# Patient Record
Sex: Male | Born: 1960 | Race: White | Hispanic: No | Marital: Married | Smoking: Never smoker
Health system: Southern US, Community
[De-identification: ages and names within clinical notes are randomized; demographics above are authoritative.]

## PROBLEM LIST (undated history)

## (undated) DIAGNOSIS — E039 Hypothyroidism, unspecified: Secondary | ICD-10-CM

## (undated) DIAGNOSIS — E785 Hyperlipidemia, unspecified: Secondary | ICD-10-CM

## (undated) DIAGNOSIS — C8190 Hodgkin lymphoma, unspecified, unspecified site: Secondary | ICD-10-CM

## (undated) DIAGNOSIS — I2510 Atherosclerotic heart disease of native coronary artery without angina pectoris: Secondary | ICD-10-CM

## (undated) HISTORY — DX: Atherosclerotic heart disease of native coronary artery without angina pectoris: I25.10

## (undated) HISTORY — PX: CORONARY ANGIOPLASTY: SHX604

## (undated) HISTORY — DX: Hyperlipidemia, unspecified: E78.5

## (undated) HISTORY — DX: Hypothyroidism, unspecified: E03.9

## (undated) HISTORY — DX: Hodgkin lymphoma, unspecified, unspecified site: C81.90

---
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
## 1999-07-03 HISTORY — PX: NECK LESION BIOPSY: SHX2078

## 2004-08-08 ENCOUNTER — Ambulatory Visit: Payer: Self-pay | Admitting: Oncology

## 2004-08-10 ENCOUNTER — Ambulatory Visit: Payer: Self-pay | Admitting: Oncology

## 2004-08-30 ENCOUNTER — Ambulatory Visit: Payer: Self-pay | Admitting: Oncology

## 2005-02-08 ENCOUNTER — Ambulatory Visit: Payer: Self-pay | Admitting: Oncology

## 2005-03-02 ENCOUNTER — Ambulatory Visit: Payer: Self-pay | Admitting: Oncology

## 2005-08-07 ENCOUNTER — Ambulatory Visit: Payer: Self-pay | Admitting: Oncology

## 2005-08-09 ENCOUNTER — Ambulatory Visit: Payer: Self-pay | Admitting: Oncology

## 2005-08-30 ENCOUNTER — Ambulatory Visit: Payer: Self-pay | Admitting: Oncology

## 2005-12-24 IMAGING — CT CT CHEST-ABD-PELV W/ CM
1 of 3 series · 14 of 30 positions shown, 18 images · non-contrast
Comparison: none

REASON FOR EXAM: Hodgkin's disease
COMMENTS:

[Series 3: inspace · axial · 0.74mm/px · z∈[-636,-34]mm · 14 of 666 slices shown, 18 images]
[im 32/666  mediastinal]
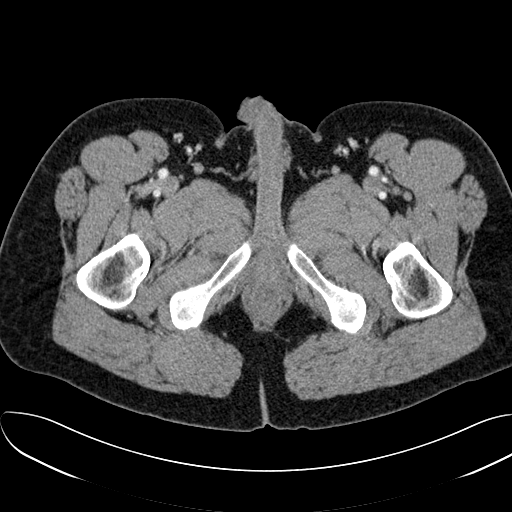
[im 32/666  bone]
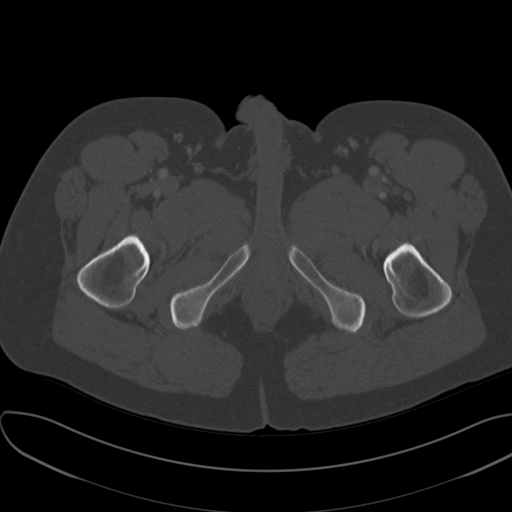
[im 96/666  mediastinal]
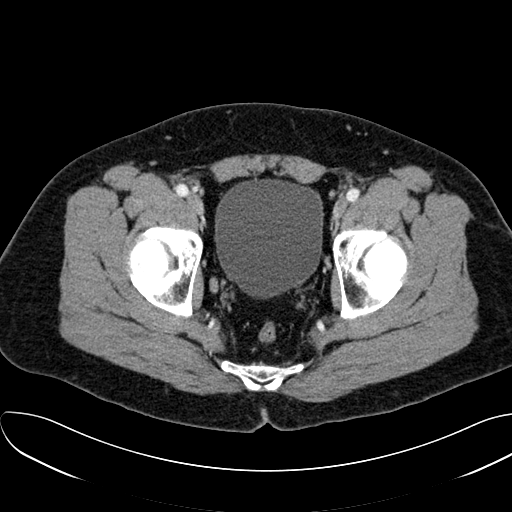
[im 159/666  mediastinal]
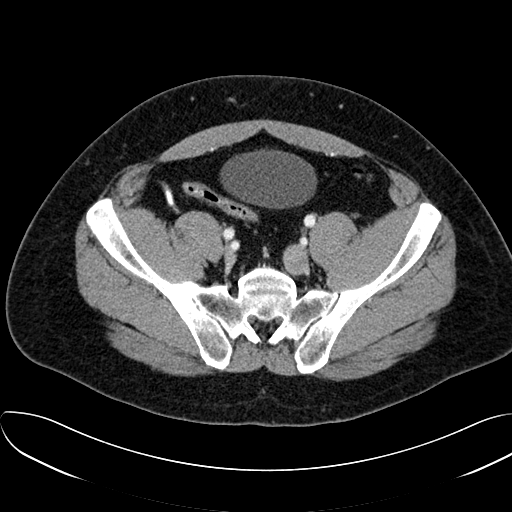
[im 222/666  mediastinal]
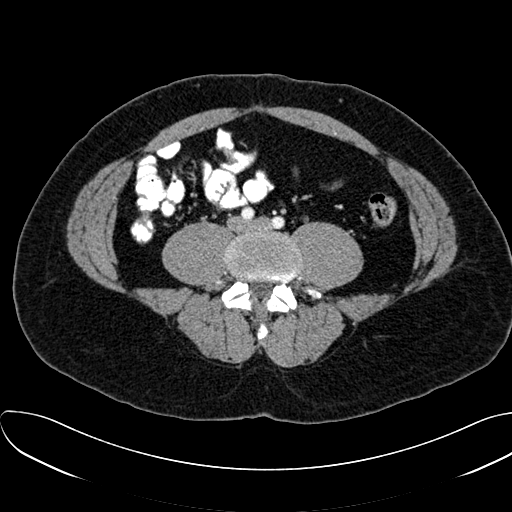
[im 254/666  mediastinal]
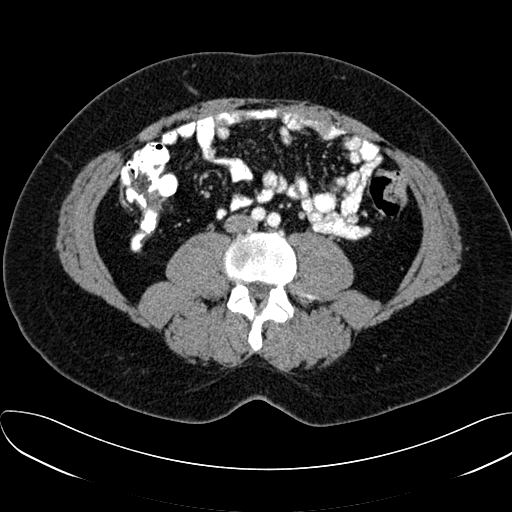
[im 317/666  mediastinal]
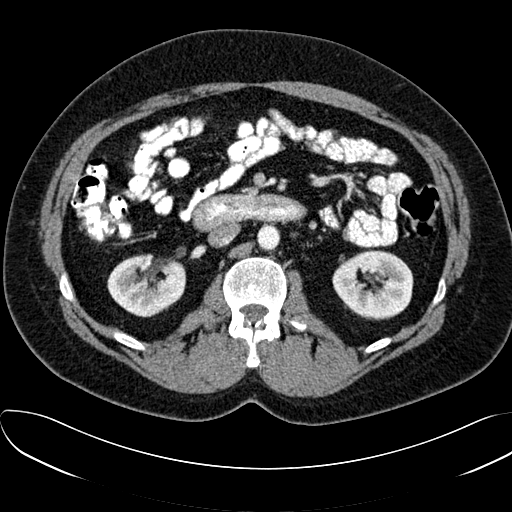
[im 349/666  mediastinal]
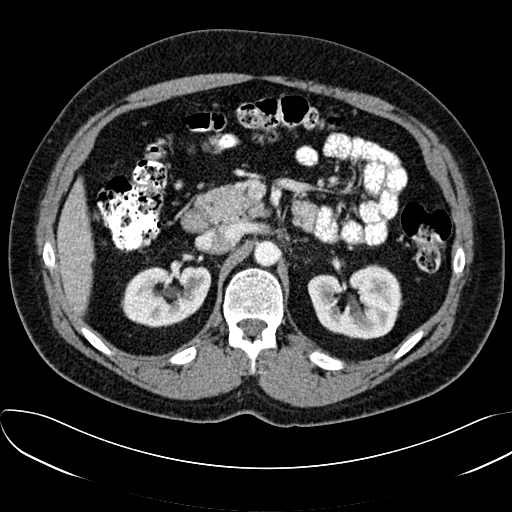
[im 412/666  mediastinal]
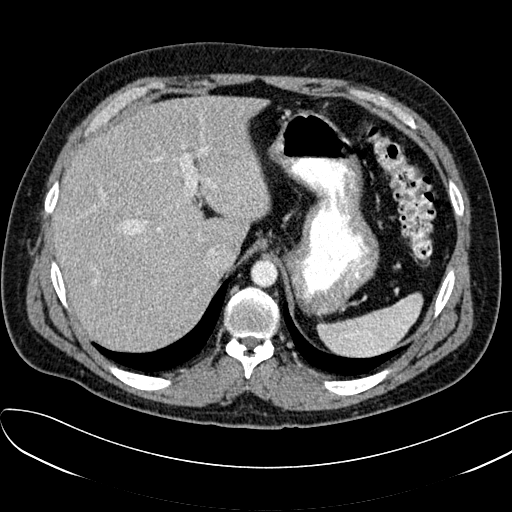
[im 444/666  mediastinal]
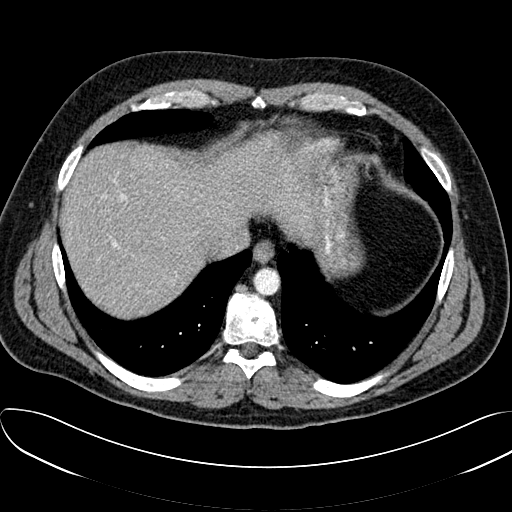
[im 444/666  bone]
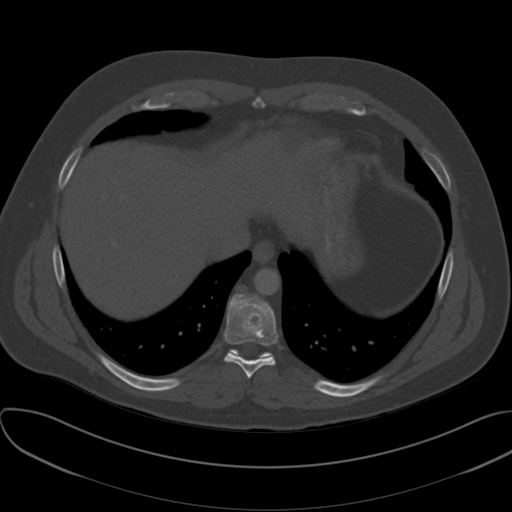
[im 507/666  mediastinal]
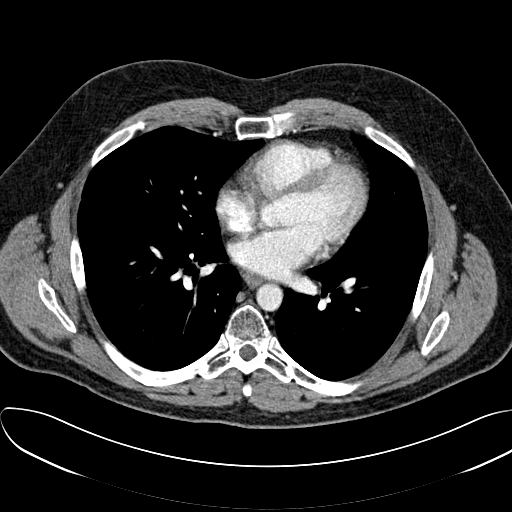
[im 539/666  lung]
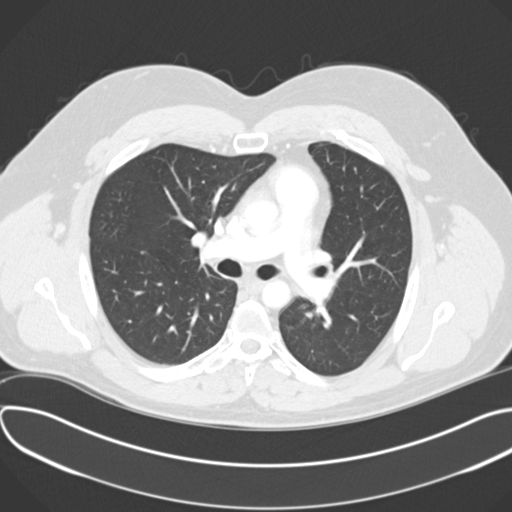
[im 571/666  mediastinal]
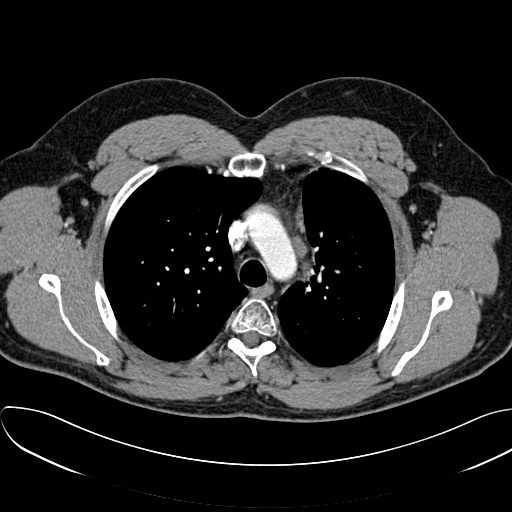
[im 571/666  lung]
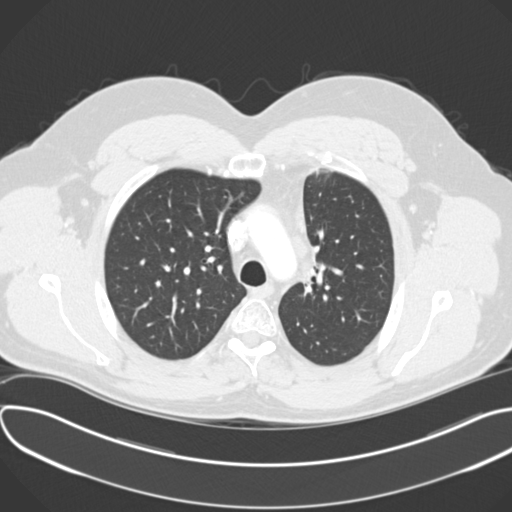
[im 602/666  lung]
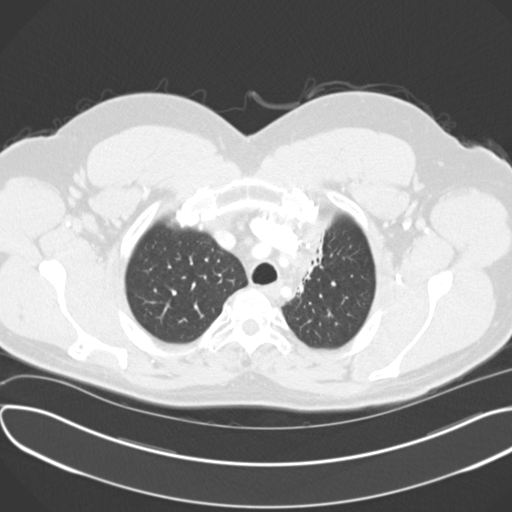
[im 634/666  mediastinal]
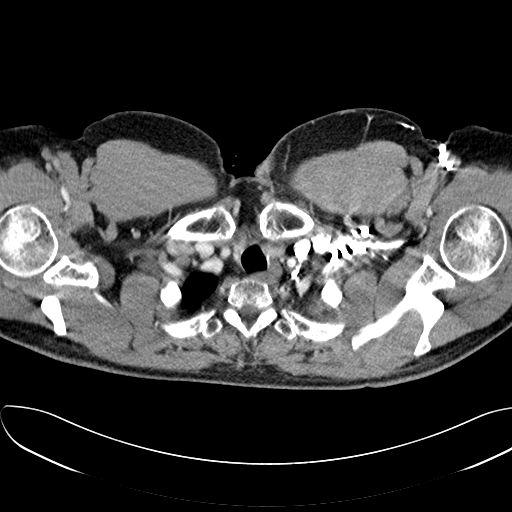
[im 634/666  lung]
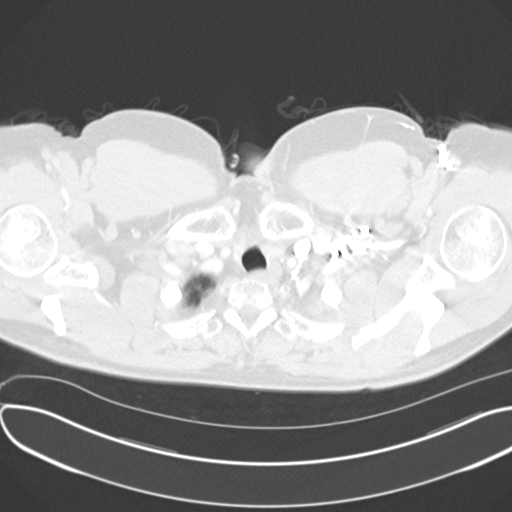

[14 of 30 positions shown; findings below may reference images not displayed]

PROCEDURE:     CT  - CT CHEST ABDOMEN AND PELVIS W  - August 08, 2004  [DATE]

RESULT:     Comparison is made to the prior similar CT scan performed on
08-03-03.

CT CHEST:  There is increased soft tissue density in the paramediastinal
region in the LEFT upper lobe, which was present on the prior study and
appears to be relatively stable.  This extends to the periaortic region at
the level of the aortic arch.  There is no evidence of axillary adenopathy.
No other areas of soft tissue density are seen in the hilar or mediastinal
region.  Lung window images show no evidence of focal infiltrate or focal
pulmonary masses.  There is some minimal atelectasis in the paramedial
region of the LEFT upper lobe adjacent to the area of soft tissue density
mentioned above.

CT ABDOMEN AND PELVIS:  IV and oral contrast-enhanced CT scan of the abdomen
and pelvis is compared to the prior study of 08-03-03.  The liver, spleen,
pancreas, gallbladder, and adrenal glands are normal.  The abdominal aorta
is normal in caliber.  There is no evidence of retroperitoneal, pelvic, or
inguinal adenopathy.  Some prostate calcification is noted.  No abnormal
bowel distention or significant bowel wall thickening is appreciated.  The
wall of the transverse portion of the duodenum appears to be slightly
thickened.  Correlation with endoscopy would be recommended.  This was not
seen on the prior study. The stomach appears to be unremarkable.  Some
scattered diverticula are seen within the colon.
IMPRESSION: Stable-appearing CT scan of the chest with no evidence of disease
progression.

Change in the appearance of the transverse portion of the duodenum.
Correlation with endoscopy would be recommended given the appearance of wall
thickening, which certainly could represent either inflammatory process or
the possibility of lymphomatous infiltration of the wall of the small bowel.
 The study is otherwise stable with stable-appearing paramedian density in
the chest as noted above.

## 2006-02-07 ENCOUNTER — Ambulatory Visit: Payer: Self-pay | Admitting: Oncology

## 2006-03-02 ENCOUNTER — Ambulatory Visit: Payer: Self-pay | Admitting: Oncology

## 2006-12-23 IMAGING — CT CT CHEST-ABD-PELV W/ CM
1 of 2 series · 14 of 29 positions shown, 19 images · non-contrast
Comparison: none

REASON FOR EXAM: Unkuri disease
COMMENTS:

PROCEDURE:     CT  - CT CHEST ABDOMEN AND PELVIS W  - August 07, 2005  [DATE]
RESULT:
REASON FOR CONSULTATION:   Hodgkin's disease.  One year followup.
TECHNIQUE: Axial images were obtained from the apices to the pubic
symphysis post intravenous and oral administration of contrast.  Mediastinal
and lung window settings were obtained of the chest.  The present study is
compared with prior exam of 08/08/04.

[Series 2: soft tissue · axial · 0.74mm/px · z∈[-544,+32]mm · 14 of 131 slices shown, 19 images]
[im 8/131  mediastinal]
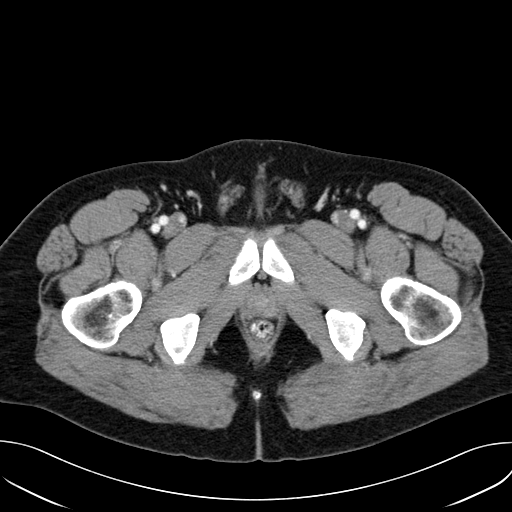
[im 8/131  bone]
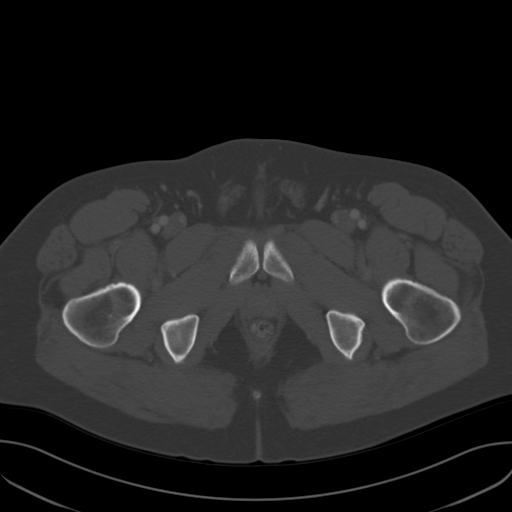
[im 22/131  mediastinal]
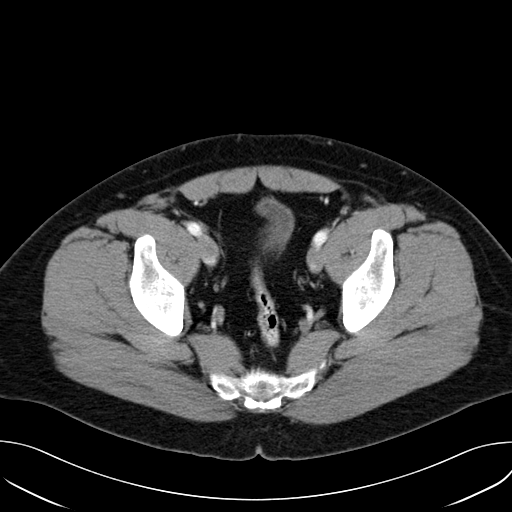
[im 29/131  mediastinal]
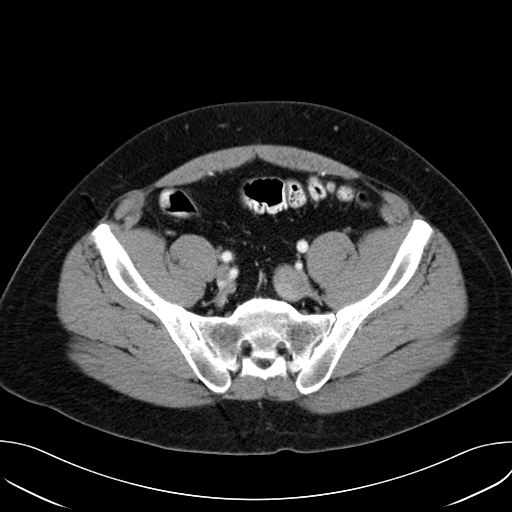
[im 37/131  mediastinal]
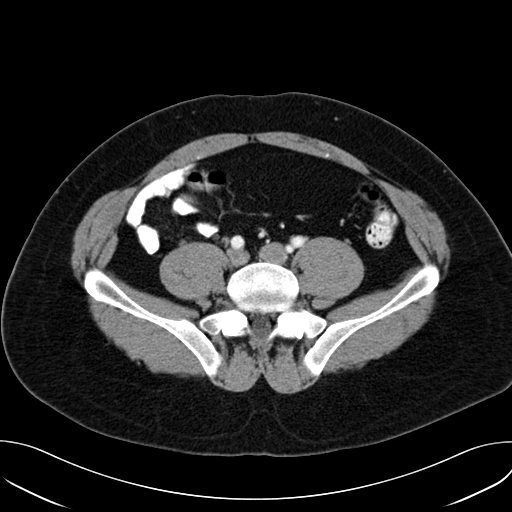
[im 51/131  mediastinal]
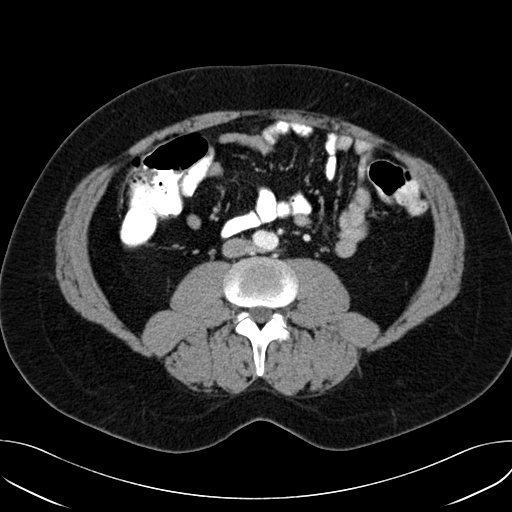
[im 58/131  mediastinal]
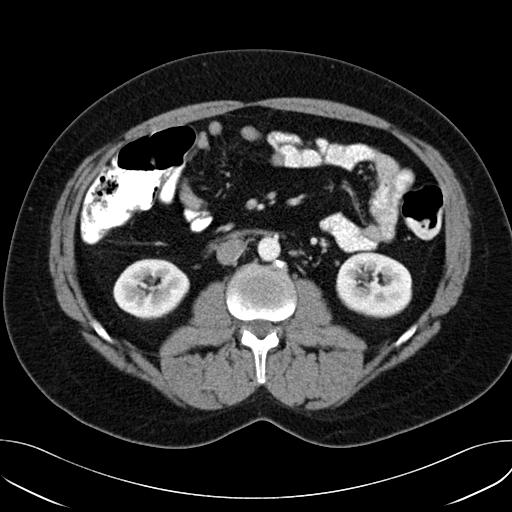
[im 66/131  mediastinal]
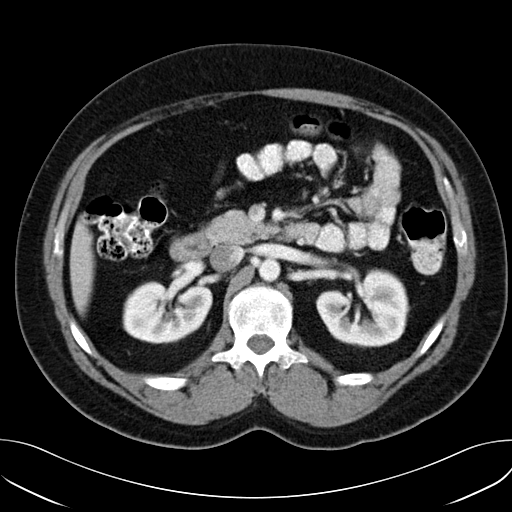
[im 73/131  mediastinal]
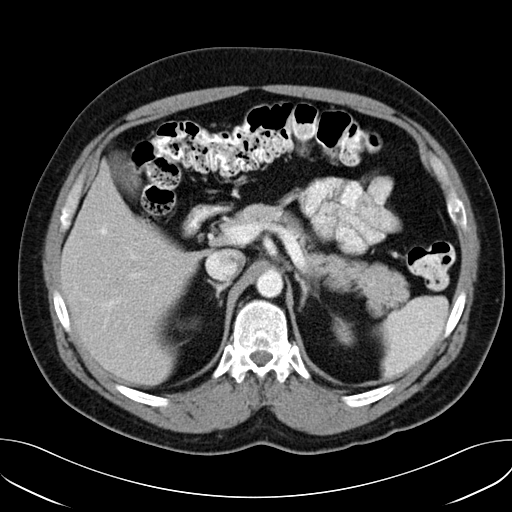
[im 80/131  mediastinal]
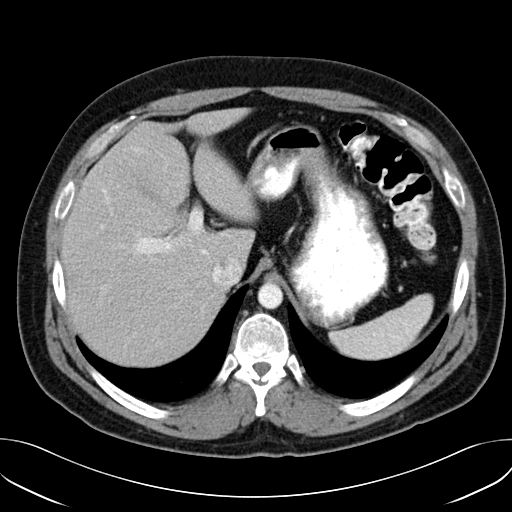
[im 80/131  bone]
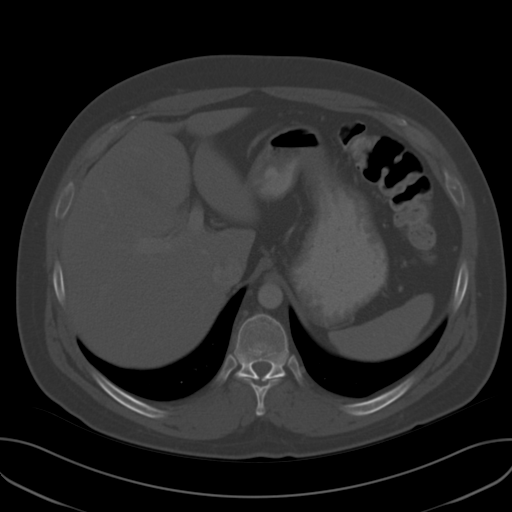
[im 94/131  mediastinal]
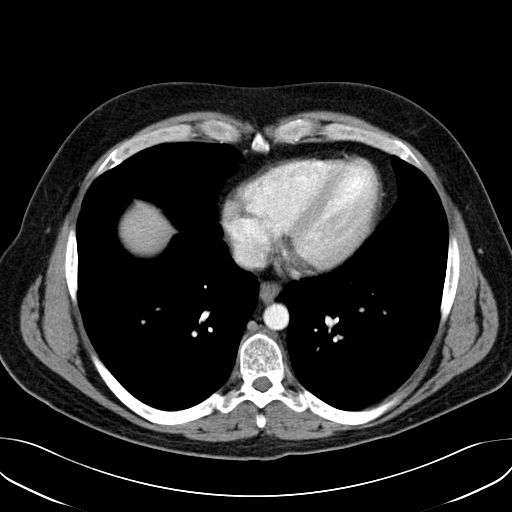
[im 102/131  mediastinal]
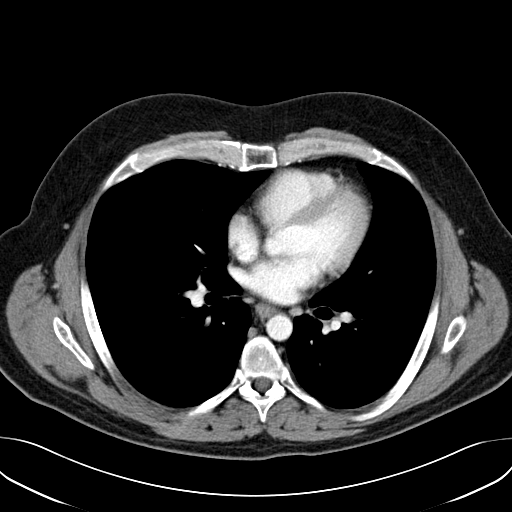
[im 102/131  lung]
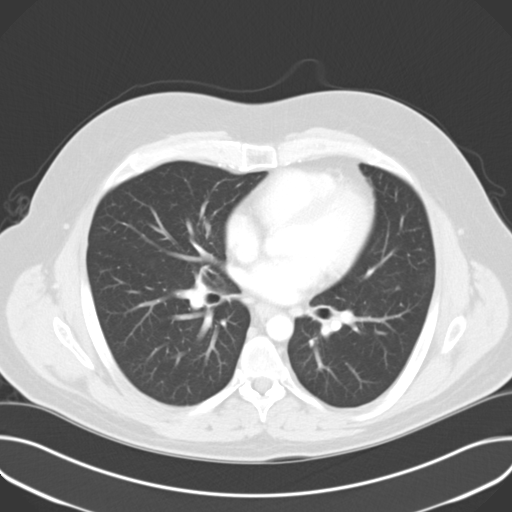
[im 109/131  mediastinal]
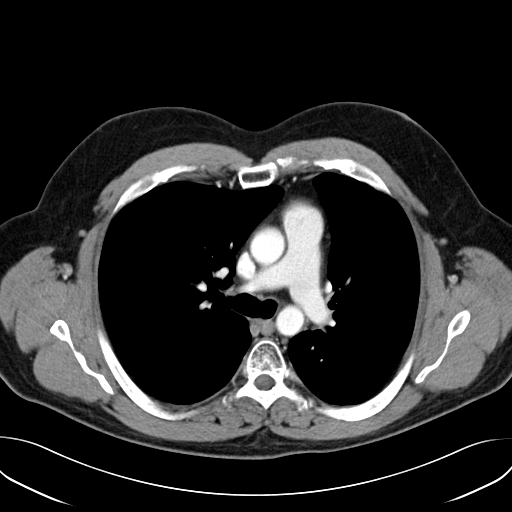
[im 109/131  lung]
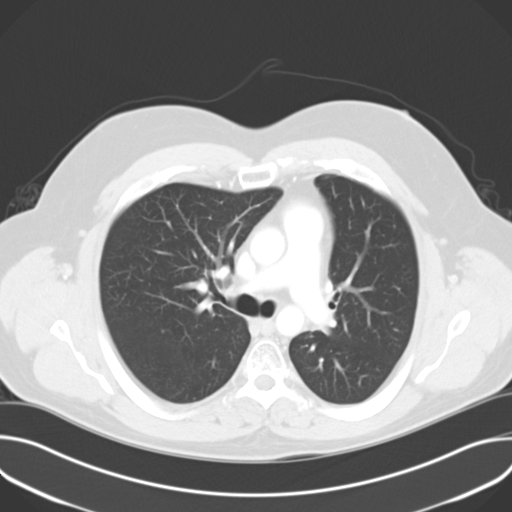
[im 116/131  lung]
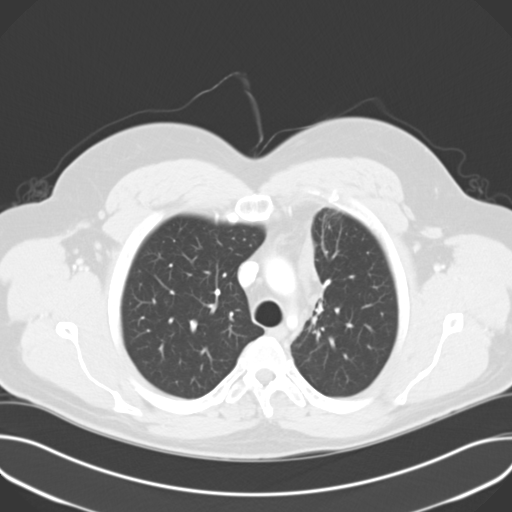
[im 123/131  mediastinal]
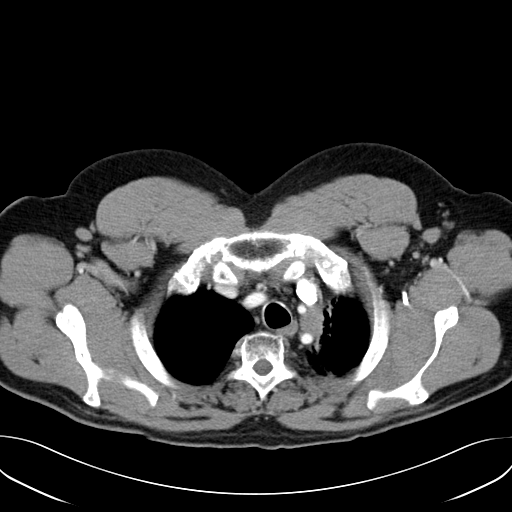
[im 123/131  lung]
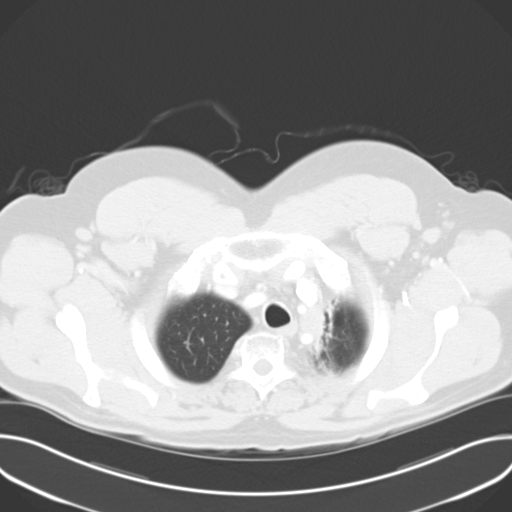

[14 of 29 positions shown; findings below may reference images not displayed]

FINDINGS: There is again noted LEFT paramediastinal soft tissue density
appearing unchanged in size from the previous exam of 08/08/04.  No hilar or
subcarinal adenopathy is noted.  On the lung window settings, there is some
atelectasis or scarring in the LEFT upper lobe adjacent to the soft tissue
density.   The lung window settings reveal the lung fields to be clear.  No
additional findings are noted on the lung window settings.  No effusions are
seen.

ABDOMINAL AND PELVIC CT

No retroperitoneal or pelvic adenopathy is identified.  Prostatic
calcifications are again seen.  The bowel appears intact.  No thickening of
the duodenum is noted on today's study.  Both kidneys excrete the contrast
material.  No enlargement of the kidneys is seen.
IMPRESSION: 1.     Chest appears stable from previous exam of 08/08/04.  There is
persistent LEFT paramediastinal soft tissue density unchanged as well as
some scarring or atelectasis adjacent to the lung fields.
2.     No definite evidence of adenopathy is seen.  No significant
abnormalities are noted on the abdominal and pelvic CT.

## 2007-01-31 ENCOUNTER — Ambulatory Visit: Payer: Self-pay | Admitting: Oncology

## 2007-02-06 ENCOUNTER — Ambulatory Visit: Payer: Self-pay | Admitting: Oncology

## 2007-03-03 ENCOUNTER — Ambulatory Visit: Payer: Self-pay | Admitting: Oncology

## 2008-01-31 ENCOUNTER — Ambulatory Visit: Payer: Self-pay | Admitting: Oncology

## 2008-02-04 ENCOUNTER — Ambulatory Visit: Payer: Self-pay | Admitting: Oncology

## 2008-03-02 ENCOUNTER — Ambulatory Visit: Payer: Self-pay | Admitting: Oncology

## 2009-03-02 ENCOUNTER — Ambulatory Visit: Payer: Self-pay | Admitting: Oncology

## 2009-03-10 ENCOUNTER — Ambulatory Visit: Payer: Self-pay | Admitting: Oncology

## 2009-04-01 ENCOUNTER — Ambulatory Visit: Payer: Self-pay | Admitting: Oncology

## 2010-03-02 ENCOUNTER — Ambulatory Visit: Payer: Self-pay | Admitting: Oncology

## 2010-04-01 ENCOUNTER — Ambulatory Visit: Payer: Self-pay | Admitting: Oncology

## 2012-10-14 ENCOUNTER — Ambulatory Visit: Payer: Self-pay | Admitting: General Surgery

## 2012-11-19 ENCOUNTER — Ambulatory Visit (INDEPENDENT_AMBULATORY_CARE_PROVIDER_SITE_OTHER): Payer: 59 | Admitting: General Surgery

## 2012-11-19 ENCOUNTER — Encounter: Payer: Self-pay | Admitting: General Surgery

## 2012-11-19 VITALS — BP 124/74 | HR 76 | Resp 12 | Ht 70.0 in | Wt 221.0 lb

## 2012-11-19 DIAGNOSIS — Z1211 Encounter for screening for malignant neoplasm of colon: Secondary | ICD-10-CM

## 2012-11-19 NOTE — Patient Instructions (Addendum)
Colonoscopy A colonoscopy is an exam to evaluate your entire colon. In this exam, your colon is cleansed. A long fiberoptic tube is inserted through your rectum and into your colon. The fiberoptic scope (endoscope) is a long bundle of enclosed and very flexible fibers. These fibers transmit light to the area examined and send images from that area to your caregiver. Discomfort is usually minimal. You may be given a drug to help you sleep (sedative) during or prior to the procedure. This exam helps to detect lumps (tumors), polyps, inflammation, and areas of bleeding. Your caregiver may also take a small piece of tissue (biopsy) that will be examined under a microscope. LET YOUR CAREGIVER KNOW ABOUT:   Allergies to food or medicine.  Medicines taken, including vitamins, herbs, eyedrops, over-the-counter medicines, and creams.  Use of steroids (by mouth or creams).  Previous problems with anesthetics or numbing medicines.  History of bleeding problems or blood clots.  Previous surgery.  Other health problems, including diabetes and kidney problems.  Possibility of pregnancy, if this applies. BEFORE THE PROCEDURE   A clear liquid diet may be required for 2 days before the exam.  Ask your caregiver about changing or stopping your regular medications.  Liquid injections (enemas) or laxatives may be required.  A large amount of electrolyte solution may be given to you to drink over a short period of time. This solution is used to clean out your colon.  You should be present 60 minutes prior to your procedure or as directed by your caregiver. AFTER THE PROCEDURE   If you received a sedative or pain relieving medication, you will need to arrange for someone to drive you home.  Occasionally, there is a little blood passed with the first bowel movement. Do not be concerned. FINDING OUT THE RESULTS OF YOUR TEST Not all test results are available during your visit. If your test results are  not back during the visit, make an appointment with your caregiver to find out the results. Do not assume everything is normal if you have not heard from your caregiver or the medical facility. It is important for you to follow up on all of your test results. HOME CARE INSTRUCTIONS   It is not unusual to pass moderate amounts of gas and experience mild abdominal cramping following the procedure. This is due to air being used to inflate your colon during the exam. Walking or a warm pack on your belly (abdomen) may help.  You may resume all normal meals and activities after sedatives and medicines have worn off.  Only take over-the-counter or prescription medicines for pain, discomfort, or fever as directed by your caregiver. Do not use aspirin or blood thinners if a biopsy was taken. Consult your caregiver for medicine usage if biopsies were taken. SEEK IMMEDIATE MEDICAL CARE IF:   You have a fever.  You pass large blood clots or fill a toilet with blood following the procedure. This may also occur 10 to 14 days following the procedure. This is more likely if a biopsy was taken.  You develop abdominal pain that keeps getting worse and cannot be relieved with medicine. Document Released: 06/15/2000 Document Revised: 09/10/2011 Document Reviewed: 01/29/2008 [facility] Patient Information 2014 [place], [place].  Patient wishes to wait till September 2014 to have colonoscopy. This patient will be contacted once schedule is available. A pre-op visit will not be required. Patient has paperwork and Miralax prescription will be sent once date is arranged. Patient has been asked to  discontinue fish oil one week prior to procedure. It is okay for patient to continue 81 mg aspirin.

## 2012-11-19 NOTE — Progress Notes (Signed)
Patient ID: Steven Bolton, male   DOB: 05-Dec-1960, 52 y.o.   MRN: [id number]  Chief Complaint  Patient presents with  . Other    screening colonoscopy    HPI Steven Bolton is a 52 y.o. male here today for an colon screening. Patient has none prior.No family history of colon cancer.   HPI  History reviewed. No pertinent past medical history.  Past Surgical History  Procedure Laterality Date  . Appendectomy  1977  . Neck lesion biopsy  2001    Family History  Problem Relation Age of Onset  . Lung cancer Mother     Social History History  Substance Use Topics  . Smoking status: Never Smoker   . Smokeless tobacco: Never Used  . Alcohol Use: No    No Known Allergies  Current Outpatient Prescriptions  Medication Sig Dispense Refill  . levothyroxine (SYNTHROID, LEVOTHROID) 88 MCG tablet        No current facility-administered medications for this visit.    Review of Systems Review of Systems  Respiratory: Negative.   Cardiovascular: Negative.   Gastrointestinal: Negative.     Blood pressure 124/74, pulse 76, resp. rate 12, height 5\' 10"  (1.778 m), weight 221 lb (100.245 kg).  Physical Exam Physical Exam  Constitutional: He appears well-developed and well-nourished.  Eyes: Conjunctivae are normal. No scleral icterus.  Neck: Neck supple.  Cardiovascular: Normal rate, regular rhythm and normal heart sounds.   Pulmonary/Chest: Effort normal and breath sounds normal.  Abdominal: Soft. Bowel sounds are normal.    Data Reviewed None  Assessment    Candidate for screening colonoscopy.    Plan    Indications for the procedure as well as the risks and benefits were discussed in detail.    Patient wishes to wait till September 2014 to have colonoscopy. This patient will be contacted once schedule is available. A pre-op visit will not be required.  Patient has been asked to discontinue fish oil one week prior to procedure. It is okay for patient to continue 81 mg  aspirin.   Earline Mayotte 11/20/2012, 5:47 PM

## 2012-11-20 ENCOUNTER — Encounter: Payer: Self-pay | Admitting: General Surgery

## 2012-11-20 DIAGNOSIS — Z1211 Encounter for screening for malignant neoplasm of colon: Secondary | ICD-10-CM | POA: Insufficient documentation

## 2013-01-29 ENCOUNTER — Telehealth: Payer: Self-pay | Admitting: *Deleted

## 2013-01-29 DIAGNOSIS — Z1211 Encounter for screening for malignant neoplasm of colon: Secondary | ICD-10-CM

## 2013-01-29 MED ORDER — POLYETHYLENE GLYCOL 3350 17 GM/SCOOP PO POWD: ORAL | Status: DC

## 2013-01-29 NOTE — Telephone Encounter (Signed)
Patient was contacted today to schedule colonoscopy. This has been arranged at [facility] for 03-18-13. This patient has been asked to discontinue fish oil one week prior to procedure. He is aware it is okay to continue 81 mg aspirin. Patient will be contacted prior to verify no medication changes.   Miralax prescription has been sent to patient's pharmacy.

## 2013-03-11 ENCOUNTER — Telehealth: Payer: Self-pay | Admitting: *Deleted

## 2013-03-11 NOTE — Telephone Encounter (Signed)
Patient reports no medication changes since last office visit. He was instructed to call pre-register since he has not done so already. Patient was also reminded to stop fish oil. We will proceed with colonoscopy that is scheduled at [facility] for 03-18-13. He will call the office if he has further questions.

## 2013-03-15 ENCOUNTER — Other Ambulatory Visit: Payer: Self-pay | Admitting: General Surgery

## 2013-03-15 DIAGNOSIS — Z1211 Encounter for screening for malignant neoplasm of colon: Secondary | ICD-10-CM

## 2013-03-18 ENCOUNTER — Ambulatory Visit: Payer: Self-pay | Admitting: General Surgery

## 2013-03-18 DIAGNOSIS — Z1211 Encounter for screening for malignant neoplasm of colon: Secondary | ICD-10-CM

## 2013-03-18 DIAGNOSIS — D128 Benign neoplasm of rectum: Secondary | ICD-10-CM

## 2013-03-18 DIAGNOSIS — D129 Benign neoplasm of anus and anal canal: Secondary | ICD-10-CM

## 2013-03-19 ENCOUNTER — Encounter: Payer: Self-pay | Admitting: General Surgery

## 2013-03-23 ENCOUNTER — Encounter: Payer: Self-pay | Admitting: General Surgery

## 2013-03-24 ENCOUNTER — Telehealth: Payer: Self-pay | Admitting: *Deleted

## 2013-03-24 NOTE — Telephone Encounter (Signed)
Notified patient as instructed, patient pleased. Discussed follow-up appointments for 10 year, patient agrees

## 2013-03-24 NOTE — Telephone Encounter (Signed)
Message copied by Currie Paris on Tue Mar 24, 2013  8:09 AM ------      Message from: [place], [place] W      Created: Mon Mar 23, 2013  1:50 PM       Notify polyp fine.  Repeat exam in 10 years, earlier if any symptoms.       ----- Message -----         From: Jena Gauss, CMA         Sent: 03/23/2013  10:33 AM           To: Earline Mayotte, MD                   ------

## 2013-03-24 NOTE — Telephone Encounter (Signed)
Message copied by Currie Paris on Tue Mar 24, 2013  8:10 AM ------      Message from: [place], [place] W      Created: Mon Mar 23, 2013  1:50 PM       Notify polyp fine.  Repeat exam in 10 years, earlier if any symptoms.       ----- Message -----         From: Jena Gauss, CMA         Sent: 03/23/2013  10:33 AM           To: Earline Mayotte, MD                   ------

## 2013-07-02 HISTORY — PX: CARDIAC CATHETERIZATION: SHX172

## 2014-02-17 ENCOUNTER — Inpatient Hospital Stay: Payer: Self-pay | Admitting: Internal Medicine

## 2014-02-17 LAB — TROPONIN I
TROPONIN-I: 0.12 ng/mL — AB
Troponin-I: 0.57 ng/mL — ABNORMAL HIGH

## 2014-02-17 LAB — BASIC METABOLIC PANEL
ANION GAP: 5 — AB (ref 7–16)
BUN: 28 mg/dL — ABNORMAL HIGH (ref 7–18)
CALCIUM: 8.6 mg/dL (ref 8.5–10.1)
CO2: 31 mmol/L (ref 21–32)
CREATININE: 1.54 mg/dL — AB (ref 0.60–1.30)
Chloride: 107 mmol/L (ref 98–107)
EGFR (Non-African Amer.): 51 — ABNORMAL LOW
GFR CALC AF AMER: 59 — AB
GLUCOSE: 94 mg/dL (ref 65–99)
OSMOLALITY: 290 (ref 275–301)
POTASSIUM: 4 mmol/L (ref 3.5–5.1)
SODIUM: 143 mmol/L (ref 136–145)

## 2014-02-17 LAB — CBC
HCT: 46.7 % (ref 40.0–52.0)
HGB: 15.4 g/dL (ref 13.0–18.0)
MCH: 31.4 pg (ref 26.0–34.0)
MCHC: 32.9 g/dL (ref 32.0–36.0)
MCV: 95 fL (ref 80–100)
PLATELETS: 310 10*3/uL (ref 150–440)
RBC: 4.9 10*6/uL (ref 4.40–5.90)
RDW: 13.3 % (ref 11.5–14.5)
WBC: 9.8 10*3/uL (ref 3.8–10.6)

## 2014-02-17 LAB — PRO B NATRIURETIC PEPTIDE: B-Type Natriuretic Peptide: 27 pg/mL (ref 0–125)

## 2014-02-17 LAB — APTT: ACTIVATED PTT: 27.6 s (ref 23.6–35.9)

## 2014-02-17 LAB — PROTIME-INR
INR: 1.1
PROTHROMBIN TIME: 14.2 s (ref 11.5–14.7)

## 2014-02-17 LAB — CK-MB: CK-MB: 3.4 ng/mL (ref 0.5–3.6)

## 2014-02-18 DIAGNOSIS — I2510 Atherosclerotic heart disease of native coronary artery without angina pectoris: Secondary | ICD-10-CM

## 2014-02-18 DIAGNOSIS — I214 Non-ST elevation (NSTEMI) myocardial infarction: Secondary | ICD-10-CM

## 2014-02-18 DIAGNOSIS — I059 Rheumatic mitral valve disease, unspecified: Secondary | ICD-10-CM

## 2014-02-18 LAB — BASIC METABOLIC PANEL
ANION GAP: 9 (ref 7–16)
BUN: 21 mg/dL — ABNORMAL HIGH (ref 7–18)
CALCIUM: 8.1 mg/dL — AB (ref 8.5–10.1)
CO2: 29 mmol/L (ref 21–32)
CREATININE: 1.23 mg/dL (ref 0.60–1.30)
Chloride: 108 mmol/L — ABNORMAL HIGH (ref 98–107)
EGFR (African American): >60
Glucose: 89 mg/dL (ref 65–99)
Osmolality: 293 (ref 275–301)
Potassium: 3.9 mmol/L (ref 3.5–5.1)
Sodium: 146 mmol/L — ABNORMAL HIGH (ref 136–145)

## 2014-02-18 LAB — CBC WITH DIFFERENTIAL/PLATELET
Basophil #: 0.1 10*3/uL (ref 0.0–0.1)
Basophil %: 1 %
EOS PCT: 8.6 %
Eosinophil #: 0.7 10*3/uL (ref 0.0–0.7)
HCT: 46.1 % (ref 40.0–52.0)
HGB: 15.4 g/dL (ref 13.0–18.0)
Lymphocyte #: 2.6 10*3/uL (ref 1.0–3.6)
Lymphocyte %: 29.8 %
MCH: 31.5 pg (ref 26.0–34.0)
MCHC: 33.3 g/dL (ref 32.0–36.0)
MCV: 95 fL (ref 80–100)
Monocyte #: 1 x10 3/mm (ref 0.2–1.0)
Monocyte %: 12 %
NEUTROS ABS: 4.2 10*3/uL (ref 1.4–6.5)
Neutrophil %: 48.6 %
PLATELETS: 294 10*3/uL (ref 150–440)
RBC: 4.88 10*6/uL (ref 4.40–5.90)
RDW: 13.4 % (ref 11.5–14.5)
WBC: 8.6 10*3/uL (ref 3.8–10.6)

## 2014-02-18 LAB — CK-MB
CK-MB: 3.4 ng/mL (ref 0.5–3.6)
CK-MB: 3.3 ng/mL (ref 0.5–3.6)

## 2014-02-18 LAB — TROPONIN I: Troponin-I: 0.55 ng/mL — ABNORMAL HIGH

## 2014-02-19 ENCOUNTER — Telehealth: Payer: Self-pay | Admitting: *Deleted

## 2014-02-19 ENCOUNTER — Telehealth: Payer: Self-pay

## 2014-02-19 DIAGNOSIS — I214 Non-ST elevation (NSTEMI) myocardial infarction: Secondary | ICD-10-CM

## 2014-02-19 LAB — CK-MB: CK-MB: 2 ng/mL (ref 0.5–3.6)

## 2014-02-19 LAB — BASIC METABOLIC PANEL
Anion Gap: 8 (ref 7–16)
BUN: 14 mg/dL (ref 7–18)
CALCIUM: 8.3 mg/dL — AB (ref 8.5–10.1)
CHLORIDE: 110 mmol/L — AB (ref 98–107)
Co2: 25 mmol/L (ref 21–32)
Creatinine: 1.09 mg/dL (ref 0.60–1.30)
EGFR (African American): >60
EGFR (Non-African Amer.): >60
Glucose: 86 mg/dL (ref 65–99)
OSMOLALITY: 285 (ref 275–301)
Potassium: 4.3 mmol/L (ref 3.5–5.1)
SODIUM: 143 mmol/L (ref 136–145)

## 2014-02-19 NOTE — Telephone Encounter (Signed)
Patient stated he started Brilinta today  He is worried after reading the side effects that Brilinta will make him dizzy  He drives a lot for work   I suggested patient take the medication at home so he can see how it will effect him before he drives Keep follow up appt  Instructed him to call if he experiences any dizziness

## 2014-02-19 NOTE — Telephone Encounter (Signed)
Patient contacted regarding discharge from [facility] on 02/19/14.  Patient understands to follow up with Ignacia Bayley, NP on 03/05/14 at 1:15 at [facility]. Patient understands discharge instructions? yes Patient understands medications and regiment? yes Patient understands to bring all medications to this visit? yes

## 2014-02-19 NOTE — Telephone Encounter (Signed)
Patient called and is worried about the side effects of Brilinta. States the side effects make you dizzy. 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

## 2014-02-22 ENCOUNTER — Telehealth: Payer: Self-pay | Admitting: Nurse Practitioner

## 2014-02-22 NOTE — Telephone Encounter (Signed)
Entered in error

## 2014-02-23 ENCOUNTER — Telehealth: Payer: Self-pay

## 2014-02-23 ENCOUNTER — Encounter: Payer: Self-pay | Admitting: Cardiovascular Disease

## 2014-02-23 NOTE — Telephone Encounter (Signed)
Pt states Dr. Fletcher Anon released him to go back to work with no restrictions, states his employer wants him to stay out another week, and he needs a note stating this.

## 2014-02-24 NOTE — Telephone Encounter (Signed)
LVM 8/26

## 2014-02-24 NOTE — Telephone Encounter (Signed)
That is fine. Ask which date he wants to go back and write him a letter.

## 2014-02-24 NOTE — Telephone Encounter (Signed)
This encounter was created in error - please disregard.

## 2014-02-25 ENCOUNTER — Encounter: Payer: Self-pay | Admitting: *Deleted

## 2014-02-25 ENCOUNTER — Other Ambulatory Visit: Payer: Self-pay

## 2014-02-25 ENCOUNTER — Telehealth: Payer: Self-pay | Admitting: *Deleted

## 2014-02-25 MED ORDER — TICAGRELOR 90 MG PO TABS: 90.0000 mg | ORAL_TABLET | Freq: Two times a day (BID) | ORAL | Status: DC

## 2014-02-25 NOTE — Telephone Encounter (Signed)
Patient called to let you know after dinner has a little bit of chest tightness and sob.

## 2014-02-25 NOTE — Telephone Encounter (Signed)
Work letter faxed.

## 2014-02-25 NOTE — Telephone Encounter (Signed)
Refill sent for brilinta

## 2014-02-25 NOTE — Telephone Encounter (Signed)
That's ok.

## 2014-02-25 NOTE — Telephone Encounter (Signed)
Returned call to patient  He stated he had a small amount of chest tightness after lunch  It went away very quickly and he is feeling great now  He just wanted to make sure it was all right

## 2014-02-26 ENCOUNTER — Encounter (INDEPENDENT_AMBULATORY_CARE_PROVIDER_SITE_OTHER): Payer: Self-pay

## 2014-02-26 ENCOUNTER — Encounter: Payer: Self-pay | Admitting: Nurse Practitioner

## 2014-02-26 ENCOUNTER — Ambulatory Visit (INDEPENDENT_AMBULATORY_CARE_PROVIDER_SITE_OTHER): Payer: 59 | Admitting: Nurse Practitioner

## 2014-02-26 VITALS — BP 120/88 | HR 77 | Ht 69.0 in | Wt 207.5 lb

## 2014-02-26 DIAGNOSIS — I214 Non-ST elevation (NSTEMI) myocardial infarction: Secondary | ICD-10-CM

## 2014-02-26 DIAGNOSIS — C8190 Hodgkin lymphoma, unspecified, unspecified site: Secondary | ICD-10-CM | POA: Insufficient documentation

## 2014-02-26 DIAGNOSIS — E039 Hypothyroidism, unspecified: Secondary | ICD-10-CM | POA: Insufficient documentation

## 2014-02-26 DIAGNOSIS — R079 Chest pain, unspecified: Secondary | ICD-10-CM

## 2014-02-26 DIAGNOSIS — E785 Hyperlipidemia, unspecified: Secondary | ICD-10-CM | POA: Insufficient documentation

## 2014-02-26 DIAGNOSIS — I2510 Atherosclerotic heart disease of native coronary artery without angina pectoris: Secondary | ICD-10-CM

## 2014-02-26 MED ORDER — METOPROLOL SUCCINATE ER 25 MG PO TB24
25.0000 mg | ORAL_TABLET | Freq: Every day | ORAL | Status: DC
Start: 2014-02-26 — End: 2015-06-28

## 2014-02-26 MED ORDER — ATORVASTATIN CALCIUM 40 MG PO TABS
40.0000 mg | ORAL_TABLET | Freq: Every day | ORAL | Status: DC
Start: 2014-02-26 — End: 2015-01-13

## 2014-02-26 MED ORDER — TICAGRELOR 90 MG PO TABS: 90.0000 mg | ORAL_TABLET | Freq: Two times a day (BID) | ORAL | Status: DC

## 2014-02-26 MED ORDER — NITROGLYCERIN 0.4 MG SL SUBL: 0.4000 mg | SUBLINGUAL_TABLET | SUBLINGUAL | Status: DC | PRN

## 2014-02-26 NOTE — Patient Instructions (Addendum)
Your physician has recommended you make the following change in your medication:  Start Nitro Sublingual 0.4 mg. Place one tablet under the tongue every 5 minutes x 3 for chest pain    Your cardiac meds have been refilled as requested   Your physician recommends that you schedule a follow-up appointment in:  3 months with Dr. Fletcher Anon

## 2014-02-26 NOTE — Progress Notes (Signed)
Patient Name: Steven Bolton Date of Encounter: 02/26/2014  Primary Care Provider:  Albina Billet, MD Primary Cardiologist:  Jerilynn Mages. Fletcher Anon, MD   Patient Profile  53 y/o male with recent NSTEMI and PCI/DES to the LCX, who presents for f/u.  Problem List   Past Medical History  Diagnosis Date  . Hypothyroidism   . Hodgkin's lymphoma   . Coronary artery disease     a. 01/2014 NSTEMI/PCI: LM 20ost, LAD 20p, D1/2 nl, D2 small/nl, LCX 90p (3.5x12 Resolute Integrity DES), OM1/2/3 nl, LPL nl, RCA non-dominant, min irregs, EF 50%; b. 01/2014 Echo: EF 50-55%, diast dysfxn, mild MR, mildly dil LA.  Marland Kitchen Hyperlipidemia    Past Surgical History  Procedure Laterality Date  . Appendectomy  1977  . Neck lesion biopsy  2001  . Cardiac catheterization  2015    x1 stent  . Coronary angioplasty      Allergies  Allergies  Allergen Reactions  . Prednisone     jittery    HPI  53 y/o male with the above problem list. He was recently admitted to [facility] with chest pain and r/i for NSTEMI.  He underwent cath revealing severe LCX dzs and otw non-obs dzs.  The LCX was successfully treated with a DES.  He tolerated the procedure well and was d/c'd the following day.  He has since recovered well and has been partaking in cardiac rehab.  He did not mild, brief, subjective dyspnea on one occasion earlier this week, but this has not recurred since.  He denies chest pain, palpitations, pnd, orthopnea, n, v, dizziness, syncope, edema, weight gain, or early satiety.   Home Medications  Prior to Admission medications   Medication Sig Start Date End Date Taking? Authorizing Provider  aspirin 81 MG tablet Take 81 mg by mouth daily.   Yes Historical Provider, MD  atorvastatin (LIPITOR) 40 MG tablet Take 1 tablet (40 mg total) by mouth daily. 02/26/14  Yes Rogelia Mire, NP  azelastine (ASTELIN) 0.1 % nasal spray 1 spray as needed.  02/12/14  Yes Historical Provider, MD  cefdinir (OMNICEF) 300 MG capsule Take 300 mg  by mouth daily.  02/12/14  Yes Historical Provider, MD  cholecalciferol (VITAMIN D) 1000 UNITS tablet Take 1,000 Units by mouth 2 (two) times daily.   Yes Historical Provider, MD  levothyroxine (SYNTHROID, LEVOTHROID) 88 MCG tablet Take 88 mcg by mouth daily before breakfast.  10/27/12  Yes Historical Provider, MD  metoprolol succinate (TOPROL-XL) 25 MG 24 hr tablet Take 1 tablet (25 mg total) by mouth daily. 02/26/14  Yes Rogelia Mire, NP  Omega-3 Fatty Acids (FISH OIL) 1000 MG CAPS Take by mouth daily.   Yes Historical Provider, MD  ticagrelor (BRILINTA) 90 MG TABS tablet Take 1 tablet (90 mg total) by mouth 2 (two) times daily. 02/26/14  Yes Rogelia Mire, NP  nitroGLYCERIN (NITROSTAT) 0.4 MG SL tablet Place 1 tablet (0.4 mg total) under the tongue every 5 (five) minutes as needed for chest pain. 02/26/14   Rogelia Mire, NP    Review of Systems  As above doing well and tolerating cardiac rehab well.  He did have one brief, mild episode of subj dyspnea, which has not recurred. He sometimes feels sluggish in the morning however this resolves fairly quickly.  All other systems reviewed and are otherwise negative except as noted above.  Physical Exam  Blood pressure 120/88, pulse 77, height 5\' 9"  (1.753 m), weight 207 lb 8 oz (94.121 kg).  General: Pleasant, NAD Psych: Normal affect. Neuro: Alert and oriented X 3. Moves all extremities spontaneously. HEENT: Normal  Neck: Supple without bruits or JVD. Lungs:  Resp regular and unlabored, CTA. Heart: RRR no s3, s4, or murmurs. Abdomen: Soft, non-tender, non-distended, BS + x 4.  Extremities: No clubbing, cyanosis or edema. DP/PT/Radials 2+ and equal bilaterally.  R wrist cath site w/o bleeding/bruit/hematoma.  Accessory Clinical Findings  ECG - RSR, 77, no acute st/t changes.  Assessment & Plan  1.  NSTEMI, subsequent episode of care/CAD:  Pt presents following recent nstemi and PCI of the LCX.  Overall, he has been doing  remarkably well and is enrolled in cardiac rehab and tolerating it well.  He did have a brief episode of mild subjective dyspnea, which resolved spontaneously.  This may be r/t brilinta but has not recurred.  Cont asa, brilinta, bb, statin.  2.  HL:  Cont statin.  He will need f/u lipids and lfts @ next office visit.  3.  Dispo:  F/U in 3 mos or sooner if necessary.  Murray Hodgkins, NP 02/26/2014, 3:34 PM

## 2014-03-01 ENCOUNTER — Encounter: Payer: Self-pay | Admitting: Nurse Practitioner

## 2014-03-02 ENCOUNTER — Encounter: Payer: Self-pay | Admitting: Cardiovascular Disease

## 2014-03-05 ENCOUNTER — Encounter: Payer: Self-pay | Admitting: Nurse Practitioner

## 2014-03-20 DIAGNOSIS — E785 Hyperlipidemia, unspecified: Secondary | ICD-10-CM

## 2014-03-20 DIAGNOSIS — I2510 Atherosclerotic heart disease of native coronary artery without angina pectoris: Secondary | ICD-10-CM

## 2014-03-20 DIAGNOSIS — I214 Non-ST elevation (NSTEMI) myocardial infarction: Secondary | ICD-10-CM

## 2014-04-01 ENCOUNTER — Encounter: Payer: Self-pay | Admitting: Cardiovascular Disease

## 2014-04-20 ENCOUNTER — Encounter: Payer: Self-pay | Admitting: Cardiovascular Disease

## 2014-05-02 ENCOUNTER — Encounter: Payer: Self-pay | Admitting: Cardiovascular Disease

## 2014-05-24 ENCOUNTER — Telehealth: Payer: Self-pay | Admitting: Cardiovascular Disease

## 2014-05-24 NOTE — Telephone Encounter (Signed)
Patient has been having dizzy spells he wondered if it was the atorvastatin He had a physical recently and he was told his blood pressure was low  I told him it was most likely not the atorvastatin causing this issue  I told him the Metoprolol was likely having the effect on his BP and he could hold it until tomorrow evening since he will be driving  Patient verbalized understanding

## 2014-05-24 NOTE — Telephone Encounter (Signed)
Pt is going to be doing a lot of driving tmrw and he thinks is because his med Atorvastatin. Pt wants to know if he takes in the evening instead of the morning. Please call pt. Every morning pt get a bit dizzy after taking med so this is why he would rather take it at night.

## 2014-05-25 NOTE — Telephone Encounter (Signed)
I agree. He should get his BP/HR checked.

## 2014-05-25 NOTE — Telephone Encounter (Signed)
Spoke with patient  He stated he held is metoprolol this am  He states he "feels better than he has in a long time"  He stated he is no longer dizzzy  Discussed this with Dr. Fletcher Anon  He stated for patient to hold metoprolol until his appt 06/04/14 Patient verbalized understanding   Instructed patient to call with any symptom changes

## 2014-05-25 NOTE — Telephone Encounter (Signed)
LVM 11/24

## 2014-06-04 ENCOUNTER — Ambulatory Visit (INDEPENDENT_AMBULATORY_CARE_PROVIDER_SITE_OTHER): Payer: 59 | Admitting: Cardiovascular Disease

## 2014-06-04 ENCOUNTER — Encounter: Payer: Self-pay | Admitting: Cardiovascular Disease

## 2014-06-04 VITALS — BP 110/70 | HR 65

## 2014-06-04 DIAGNOSIS — E785 Hyperlipidemia, unspecified: Secondary | ICD-10-CM

## 2014-06-04 DIAGNOSIS — I2510 Atherosclerotic heart disease of native coronary artery without angina pectoris: Secondary | ICD-10-CM

## 2014-06-04 NOTE — Progress Notes (Signed)
HPI  53 y/o male who is here today for a follow-up visit regarding coronary artery disease. He was admitted to [facility] in October with  NSTEMI.  He underwent cath revealing severe LCX dzs and otw non-obs dzs.  The LCX was successfully treated with a DES.    He has since recovered well and attended cardiac rehabilitation briefly.  He denies chest pain, palpitations, pnd, orthopnea, n, v, dizziness, syncope, edema, weight gain, or early satiety. Metoprolol was discontinued due to dizziness. Symptoms resolved after that. He reports no side effects to medications.   Allergies  Allergen Reactions  . Prednisone     jittery     Current Outpatient Prescriptions on File Prior to Visit  Medication Sig Dispense Refill  . aspirin 81 MG tablet Take 81 mg by mouth daily.    Marland Kitchen atorvastatin (LIPITOR) 40 MG tablet Take 1 tablet (40 mg total) by mouth daily. 90 tablet 3  . azelastine (ASTELIN) 0.1 % nasal spray 1 spray as needed.     . cefdinir (OMNICEF) 300 MG capsule Take 300 mg by mouth daily.     . cholecalciferol (VITAMIN D) 1000 UNITS tablet Take 1,000 Units by mouth 2 (two) times daily.    Marland Kitchen levothyroxine (SYNTHROID, LEVOTHROID) 88 MCG tablet Take 88 mcg by mouth daily before breakfast.     . metoprolol succinate (TOPROL-XL) 25 MG 24 hr tablet Take 1 tablet (25 mg total) by mouth daily. 90 tablet 3  . nitroGLYCERIN (NITROSTAT) 0.4 MG SL tablet Place 1 tablet (0.4 mg total) under the tongue every 5 (five) minutes as needed for chest pain. 25 tablet 3  . Omega-3 Fatty Acids (FISH OIL) 1000 MG CAPS Take by mouth daily.    . ticagrelor (BRILINTA) 90 MG TABS tablet Take 1 tablet (90 mg total) by mouth 2 (two) times daily. 180 tablet 3   No current facility-administered medications on file prior to visit.     Past Medical History  Diagnosis Date  . Hypothyroidism   . Hodgkin's lymphoma   . Coronary artery disease     a. 01/2014 NSTEMI/PCI: LM 20ost, LAD 20p, D1/2 nl, D2 small/nl, LCX 90p (3.5x12  Resolute Integrity DES), OM1/2/3 nl, LPL nl, RCA non-dominant, min irregs, EF 50%; b. 01/2014 Echo: EF 50-55%, diast dysfxn, mild MR, mildly dil LA.  Marland Kitchen Hyperlipidemia      Past Surgical History  Procedure Laterality Date  . Appendectomy  1977  . Neck lesion biopsy  2001  . Cardiac catheterization  2015    x1 stent  . Coronary angioplasty       Family History  Problem Relation Age of Onset  . Lung cancer Mother   . Heart attack Mother   . Cancer Father      History   Social History  . Marital Status: Married    Spouse Name: N/A    Number of Children: N/A  . Years of Education: N/A   Occupational History  . Not on file.   Social History Main Topics  . Smoking status: Never Smoker   . Smokeless tobacco: Never Used  . Alcohol Use: No  . Drug Use: No  . Sexual Activity: Not on file   Other Topics Concern  . Not on file   Social History Narrative      PHYSICAL EXAM   BP 110/70 mmHg  Pulse 65  Constitutional: He is oriented to person, place, and time. He appears well-developed and well-nourished. No distress.  HENT: No  nasal discharge.  Head: Normocephalic and atraumatic.  Eyes: Pupils are equal and round.  No discharge. Neck: Normal range of motion. Neck supple. No JVD present. No thyromegaly present.  Cardiovascular: Normal rate, regular rhythm, normal heart sounds. Exam reveals no gallop and no friction rub. No murmur heard.  Pulmonary/Chest: Effort normal and breath sounds normal. No stridor. No respiratory distress. He has no wheezes. He has no rales. He exhibits no tenderness.  Abdominal: Soft. Bowel sounds are normal. He exhibits no distension. There is no tenderness. There is no rebound and no guarding.  Musculoskeletal: Normal range of motion. He exhibits no edema and no tenderness.  Neurological: He is alert and oriented to person, place, and time. Coordination normal.  Skin: Skin is warm and dry. No rash noted. He is not diaphoretic. No erythema. No  pallor.  Psychiatric: He has a normal mood and affect. His behavior is normal. Judgment and thought content normal.      EKG: Normal sinus rhythm with no significant ST or T wave changes   ASSESSMENT AND PLAN

## 2014-06-04 NOTE — Assessment & Plan Note (Signed)
Continue treatment with atorvastatin. I requested fasting lipid and liver profile. 

## 2014-06-04 NOTE — Patient Instructions (Signed)
Your physician recommends that you continue on your current medications as directed. Please refer to the Current Medication list given to you today.  Your physician wants you to follow-up in: 6 months. You will receive a reminder letter in the mail two months in advance. If you don't receive a letter, please call our office to schedule the follow-up appointment.  Your physician recommends that you return for lab work in:  Fasting lipid and liver within 2-3 weeks

## 2014-06-04 NOTE — Assessment & Plan Note (Signed)
He is doing very well overall with no symptoms suggestive of angina. Continue medical therapy. 

## 2014-06-08 ENCOUNTER — Other Ambulatory Visit: Payer: Self-pay | Admitting: Cardiovascular Disease

## 2014-06-09 LAB — HEPATIC FUNCTION PANEL
ALBUMIN: 4.5 g/dL (ref 3.5–5.5)
ALT: 33 IU/L (ref 0–44)
AST: 27 IU/L (ref 0–40)
Alkaline Phosphatase: 74 IU/L (ref 39–117)
BILIRUBIN DIRECT: 0.22 mg/dL (ref 0.00–0.40)
TOTAL PROTEIN: 7.7 g/dL (ref 6.0–8.5)
Total Bilirubin: 0.8 mg/dL (ref 0.0–1.2)

## 2014-06-09 LAB — LIPID PANEL W/O CHOL/HDL RATIO
Cholesterol, Total: 111 mg/dL (ref 100–199)
HDL: 46 mg/dL (ref >39)
LDL Calculated: 50 mg/dL (ref 0–99)
Triglycerides: 73 mg/dL (ref 0–149)
VLDL Cholesterol Cal: 15 mg/dL (ref 5–40)

## 2014-06-16 ENCOUNTER — Encounter: Payer: Self-pay | Admitting: Cardiovascular Disease

## 2014-07-07 ENCOUNTER — Telehealth: Payer: Self-pay | Admitting: Cardiovascular Disease

## 2014-07-07 NOTE — Telephone Encounter (Signed)
Patient is having shoulder pain  He would like to know the best pain medications to take for the pain that would be all right with his aspirin and brilinta  He also wants to know if Dr. 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

## 2014-07-07 NOTE — Telephone Encounter (Signed)
Pt is having some shoulder issues. And he wants to know what kind of meds he can take. He heard he could not take anything with Asprin. Also wanted to know since he is outside a lot. Should he change his work environment. Please advise.

## 2014-07-08 NOTE — Telephone Encounter (Signed)
Informed patient of Dr. Jacklynn Ganong response  Patient verbalized understanding

## 2014-07-08 NOTE — Telephone Encounter (Signed)
Tylenol is fine. He should avoid NSAIDs. He has no restrictions as long as he is asymptomatic.

## 2014-07-28 ENCOUNTER — Telehealth: Payer: Self-pay | Admitting: Cardiovascular Disease

## 2014-07-28 NOTE — Telephone Encounter (Signed)
Informed patient of Dr. Jacklynn Ganong response  Patient verbalized understanding

## 2014-07-28 NOTE — Telephone Encounter (Signed)
Yes

## 2014-07-28 NOTE — Telephone Encounter (Signed)
Pt calling stating that he has a head cold and that he is just calling to ask what kinds of over the counter medications can he take.  Please call patient.  I suggested to patient that he should also call pcp just in case.  Pt understood and will call them as well but would like to hear from [place] too.

## 2014-07-28 NOTE — Telephone Encounter (Signed)
Patient would like to know what he can take OTC for his cold?

## 2014-10-23 NOTE — H&P (Signed)
PATIENT NAME:  Steven Bolton, Steven Bolton MR#:  [id number] DATE OF BIRTH:  [birth year]  DATE OF ADMISSION:  02/17/2014  PRIMARY CARE PHYSICIAN: Sharlet Salina C. Hall Busing, MD.  CHIEF COMPLAINT: Chest pain.   HISTORY OF PRESENT ILLNESS: This is a 54 year old male who presents to the hospital complaining of substernal chest pain intermittently on and off for about a week. He describes the symptoms as being pain/pressure in the center of his chest, sometimes radiating to his neck and into his left arm, also to his lower back. The symptoms last anywhere from a few minutes to 5 minutes. The patient said he has been battling inner ear infections now for quite a while and he thought that his symptoms are probably related to the side effects from the antibiotics he has been taking for inner his ear infections. Today, he had an episode that was much more severe than he has had in the past week. Therefore, he came to the ER for further evaluation. In the Emergency Room, the patient was noted to have a mildly elevated troponin at 0.12, but no EKG changes. Hospice services were contacted for further treatment and evaluation.   REVIEW OF SYSTEMS: CONSTITUTIONAL: No documented fever. No weight gain or weight loss.  EYES: No blurred or double vision.  EARS. NOSE AND THROAT: No tinnitus. No postnasal drip. Normal oropharynx.  RESPIRATORY: No cough. No wheeze, no hemoptysis, no dyspnea.  CARDIOVASCULAR: Positive chest pain. No orthopnea, palpitations, syncope.  GASTROINTESTINAL: No nausea, vomiting, diarrhea, abdominal pain. No melena or hematochezia.  GENITOURINARY: No dysuria and hematuria.  ENDOCRINE: No polyuria or nocturia. No heat or cold intolerance.  HEMATOLOGIC: No anemia, no bruising, no bleeding.  INTEGUMENTARY: No rashes.  MUSCULOSKELETAL: No arthritis, no swelling, no gout.  NEUROLOGIC: No numbness, tingling. No ataxia. No seizure-type activity.  PSYCHIATRIC: No anxiety, no insomnia. No ADD.   PAST MEDICAL HISTORY:  Consistent with hypothyroidism, a recent inner ear infection, history of Hodgkin's lymphoma.   ALLERGIES: No known drug allergies.   SOCIAL HISTORY: No smoking. No alcohol abuse. No illicit drug abuse. Lives at home with his wife.   FAMILY HISTORY: Mother and father are both deceased. Both died from complications of lung cancer. Both were heavy smokers.   CURRENT MEDICATIONS: As follows: Aspirin 81 mg daily, Cefdinir 300 mg b.i.d. x 10 days, vitamin C 1 tablet daily, fish oil 1 tablet daily, Synthroid 88 mcg daily; vitamin D3, 1 tablet b.i.d.   PHYSICAL EXAMINATION: Presently is as follows:  VITAL SIGNS: Vital signs are noted to be: Temperature is 99, pulse 60, respirations 18, blood pressure 134/85. Saturations are 98% on room air.  GENERAL: The patient is a pleasant -appearing male in no apparent distress.  HEAD, EYES, EARS, NOSE AND THROAT: Atraumatic, normocephalic. Extraocular muscles are intact. Pupils equal and reactive to light. Sclerae anicteric. No conjunctival injection. No pharyngeal erythema.  NECK: Supple. There is no jugular venous distention. No bruits, no lymphadenopathy, no thyromegaly.  HEART: Regular rate and rhythm. No murmurs, no rubs, no clicks. He does have a 2/6 systolic ejection murmur heard at the sternal border. No rubs, no clicks.  LUNGS: Clear to auscultation bilaterally. No rales or rhonchi. No wheezes.  ABDOMEN: Soft, flat, nontender, nondistended. Has good bowel sounds. No hepatosplenomegaly appreciated.  EXTREMITIES: No evidence of any cyanosis, clubbing, peripheral edema. Has +2 pedal and radial pulses bilaterally.  NEUROLOGICAL: The patient is alert, awake, and oriented x3 with no focal motor or sensory deficits appreciated.  SKIN: Moist and warm  with no rashes appreciated.  LYMPHATIC: There is no cervical lymphadenopathy.   LABORATORY DATA: Serum glucose of 94 BUN 28, creatinine 1.5. Sodium 143, potassium 4, chloride 107, bicarbonate 31. The patient's  troponin is 0.12. White cell count 9.8, hemoglobin 15.4, hematocrit 46.7, platelet count 310.   ASSESSMENT AND PLAN: This is a 54 year old male with a history of hypothyroidism, history of Hodgkin's lymphoma, history of recent inner ear infection, who presents to the hospital due to intermittent chest pain, ongoing for week and noted to have an elevated troponin.   PROBLEM LIST:  1. Unstable angina. This is likely the diagnosis given the patient's progressive chest pain getting worse, and elevated troponin. I will start the patient on aspirin, some low-dose beta blocker and a statin. Nitroglycerin as needed, also morphine as needed for pain and a heparin nomogram. Will cycle cardiac markers, place him on telemetry. We will get a 2-dimensional echocardiogram. Will also get a cardiology consult. Discussed the case with Dr. Fletcher Anon who will do a cardiac catheterization tomorrow morning.  2. Hypothyroidism. Continue Synthroid.  3. History of recent inner ear infection. Continue Cefdinir.  4. History of Hodgkin's lymphoma, currently in remission.   CODE STATUS: The patient is a FULL CODE.   TIME SPENT ON THIS ADMISSION: 45 minutes.   ____________________________ Belia Heman. Verdell Carmine, MD vjs:ls D: 02/17/2014 18:51:00 ET T: 02/17/2014 20:11:58 ET JOB#: [id number]  cc: Belia Heman. Verdell Carmine, MD, <Dictator>  Henreitta Leber MD ELECTRONICALLY SIGNED 02/27/2014 11:46

## 2014-10-23 NOTE — Discharge Summary (Signed)
PATIENT NAME:  Steven Bolton, Steven Bolton MR#:  [id number] DATE OF BIRTH:  [birth year]  DATE OF ADMISSION:  02/17/2014 DATE OF DISCHARGE:  02/19/2014   DISCHARGE DIAGNOSES:  1.  Coronary artery disease, status post percutaneous coronary intervention to the circumflex.  2.  Hypothyroidism.  3.  Hyperlipidemia.   CONSULTS: Muhammad A. Fletcher Anon, MD  PROCEDURES: Cardiac catheterization with 90% stenosis in the proximal circumflex, status post drug-eluting stent, with EF of 50% showed on echocardiogram.   ADMITTING HISTORY AND PHYSICAL AND HOSPITAL COURSE: Please see detailed H and P dictated by Dr. Abel Presto. In brief, a 54 year old male patient with history of hypothyroidism presented to the hospital complaining of progressively worsening chest pain, was admitted for unstable angina. He did have mildly elevated troponin.   The patient was admitted to the telemetry floor. Checked 2 more sets of troponin, which were stable. He was diagnosed with a small NSTEMI by cardiology, taken to the cardiac catheterization lab and had 90% stenosis in the proximal circumflex and had a stent placed. Monitored overnight in the CCU without any complications. He has been started on aspirin, beta blocker, Brilinta, statin and has been referred to cardiac rehab at discharge. He is chest pain free prior to discharge.   Prior to discharge, his lungs sound clear. S1, S2 heard. No edema.   Follow up with cardiology in 1-2 weeks.   The patient has been given a work note to stay off work until 03/01/2014.   DISCHARGE MEDICATIONS:  1.  Synthroid 88 mcg daily.  2.  Fish oil oral capsule once daily.  3.  Ester C 1 tablet daily.  4.  Aspirin 81 mg daily.  5.  Vitamin D3 1 tablet oral 2 times a day.  6.  Atorvastatin 40 mg oral once a day.  7.  Brilinta 90 mg oral 2 times a day.  8.  Metoprolol succinate 25 mg oral extended-release once a day.   DISCHARGE INSTRUCTIONS: Low-fat, low-cholesterol diet. Activity as tolerated. No  heavy lifting. Follow-up with Dr. Fletcher Anon in 1 week. Stay off work until 03/01/2014.   Discharge time spent on day of discharge was 40 minutes.    ____________________________ Leia Alf Keelan Tripodi, MD srs:MT D: 02/20/2014 57:32:20 ET T: 02/20/2014 13:23:26 ET JOB#: [id number]  cc: Alveta Heimlich R. Dedrick Heffner, MD, <Dictator> Muhammad A. Fletcher Anon, MD Leona Carry Hall Busing, MD  Neita Carp MD ELECTRONICALLY SIGNED 03/16/2014 16:29

## 2014-10-23 NOTE — Consult Note (Signed)
PATIENT NAME:  Steven Bolton, Steven Bolton MR#:  [id number] DATE OF BIRTH:  04-Oct-1960  DATE OF CONSULTATION:  02/18/2014  CONSULTING PHYSICIAN:  Michaela Shankel A. Fletcher Anon, MD.  PRIMARY CARE PHYSICIAN:  Leona Carry. Hall Busing, MD.  REQUESTING PHYSICIAN:  Belia Heman. Verdell Carmine, MD.  REASON FOR CONSULTATION: Myocardial infarction.   HISTORY OF PRESENT ILLNESS: This is a 54 year old pleasant male with no previous cardiac history and no significant risk factors for coronary artery disease. He presented to the Emergency Room with substernal chest pain which happened at rest and lasted for about an hour. It was described as a tightness feeling radiating to his neck and left arm. He has been having similar, but less intense symptoms which started about 1 week ago. The symptoms initially were mainly exertional. However, the episode yesterday was with minimal activities. He denies any previous similar symptoms. EKG showed no acute changes. He was found to have mildly elevated troponin level. He is currently chest pain free. He has been having problems with inner ear infection and he thought that the symptoms might have been related to that.   PAST MEDICAL HISTORY:  Hypothyroidism and history of Hodgkin's lymphoma.   HOME MEDICATIONS: Aspirin 81 mg daily, cefdinir 300 mg twice daily, fish oil, vitamin C, Synthroid 88 mcg once daily and vitamin D 3.   ALLERGIES: NO KNOWN DRUG ALLERGIES.   SOCIAL HISTORY: Negative for smoking, alcohol or recreational drug use.   FAMILY HISTORY: Family history is remarkable for lung cancer. There is no family history of premature coronary artery disease.   REVIEW OF SYSTEMS: A 10-point review of systems was performed. It is negative other than what is mentioned in the HPI.   PHYSICAL EXAMINATION:  GENERAL: The patient appears to be at his stated age, in no acute distress.  VITAL SIGNS: Temperature 98.1, pulse 56, respiratory rate 18, blood pressure 128/82, and oxygen saturation is 97% on room air.   HEENT: Normocephalic, atraumatic.  NECK: No JVD carotid bruits.  RESPIRATORY: Normal respiratory effort with no use of accessory muscles. Auscultation reveals normal breath sounds.  CARDIOVASCULAR: Normal PMI. Normal S1 and S2 with no gallops or murmurs.  ABDOMEN: Benign, nontender, and nondistended.  EXTREMITIES: No clubbing, cyanosis, or edema.  SKIN: Warm and dry with no rash.  PSYCHIATRIC: He is alert, oriented x3 with normal mood and affect.   LABORATORY, DIAGNOSTIC DATA:  ECG showed normal sinus rhythm with no significant ST or T wave changes. Labs showed a creatinine of 1.54 which improved with hydration to 1.23. Troponin was 0.12 and peaked at 0.57. CBC is unremarkable.   IMPRESSION: 1.  Small non-ST-elevation myocardial infarction.  2.  Mild acute renal failure with improvement with hydration.   RECOMMENDATIONS: I agree with current medical therapy including aspirin, unfractionated heparin, a statin, and small dose beta blocker. I discussed different management options with the patient and recommend proceeding with cardiac catheterization and possible coronary intervention. Risks, benefits and alternatives were discussed. The patient will require aggressive treatment of risk factors. Further recommendations to follow after cardiac catheterization.     ____________________________ Mertie Clause Fletcher Anon, MD maa:DT D: 02/18/2014 10:25:02 ET T: 02/18/2014 14:57:06 ET JOB#: [id number]  cc: Rogue Jury A. Fletcher Anon, MD, <Dictator> Leona Carry. Hall Busing, MD [place] Verdell Carmine, MD Rogue Jury Ferne Reus MD ELECTRONICALLY SIGNED 03/18/2014 7:31

## 2014-12-03 ENCOUNTER — Ambulatory Visit: Payer: Self-pay | Admitting: Cardiovascular Disease

## 2014-12-09 ENCOUNTER — Encounter: Payer: Self-pay | Admitting: Cardiovascular Disease

## 2014-12-09 ENCOUNTER — Ambulatory Visit (INDEPENDENT_AMBULATORY_CARE_PROVIDER_SITE_OTHER): Payer: 59 | Admitting: Nurse Practitioner

## 2014-12-09 VITALS — BP 122/72 | HR 60 | Ht 69.0 in | Wt 198.0 lb

## 2014-12-09 DIAGNOSIS — I2510 Atherosclerotic heart disease of native coronary artery without angina pectoris: Secondary | ICD-10-CM

## 2014-12-09 DIAGNOSIS — E785 Hyperlipidemia, unspecified: Secondary | ICD-10-CM | POA: Diagnosis not present

## 2014-12-09 NOTE — Patient Instructions (Signed)
Medication Instructions:  Your physician recommends that you continue on your current medications as directed. Please refer to the Current Medication list given to you today.   Labwork: none  Testing/Procedures: none  Follow-Up: Your physician wants you to follow-up in: six months with Dr. Fletcher Anon.  You will receive a reminder letter in the mail two months in advance. If you don't receive a letter, please call our office to schedule the follow-up appointment.   Any Other Special Instructions Will Be Listed Below (If Applicable).

## 2014-12-09 NOTE — Progress Notes (Signed)
Patient Name: Steven Bolton Date of Encounter: 12/09/2014  Primary Care Provider:  Albina Billet, MD Primary Cardiologist:  Jerilynn Mages. Fletcher Anon, MD   Chief Complaint  54 y/o male s/p NSTEMI and LCX DES in 01/2014, who presents for f/u.  Past Medical History   Past Medical History  Diagnosis Date  . Hypothyroidism   . Hodgkin's lymphoma   . Coronary artery disease     a. 01/2014 NSTEMI/PCI: LM 20ost, LAD 20p, D1/2 nl, D2 small/nl, LCX 90p (3.5x12 Resolute Integrity DES), OM1/2/3 nl, LPL nl, RCA non-dominant, min irregs, EF 50%; b. 01/2014 Echo: EF 50-55%, diast dysfxn, mild MR, mildly dil LA.  Marland Kitchen Hyperlipidemia    Past Surgical History  Procedure Laterality Date  . Appendectomy  1977  . Neck lesion biopsy  2001  . Cardiac catheterization  2015    x1 stent  . Coronary angioplasty      Allergies  Allergies  Allergen Reactions  . Prednisone     jittery    HPI  54 y/o male with the above problem list.  He is s/p PCI/DES to the LCX in 01/2014 in the setting of a NSTEMI.  He was last seen in clinic in 06/2014 @ which time he was doing well.  Over the past 6 months, he has continued to well, exercising 4-5 days/wk - both cardio and weight training.  He has not had any chest pain, dyspnea, pnd, orthopnea, n, v, dizziness, syncope, edema, or early satiety.  He continues to tolerate his medications well, though notes that he does bruise fairly easily.  Home Medications  Prior to Admission medications   Medication Sig Start Date End Date Taking? Authorizing Provider  aspirin 81 MG tablet Take 81 mg by mouth daily.   Yes Historical Provider, MD  atorvastatin (LIPITOR) 40 MG tablet Take 1 tablet (40 mg total) by mouth daily. 02/26/14  Yes Rogelia Mire, NP  azelastine (ASTELIN) 0.1 % nasal spray 1 spray as needed.  02/12/14  Yes Historical Provider, MD  cholecalciferol (VITAMIN D) 1000 UNITS tablet Take 1,000 Units by mouth 2 (two) times daily.   Yes Historical Provider, MD  levothyroxine  (SYNTHROID, LEVOTHROID) 88 MCG tablet Take 88 mcg by mouth daily before breakfast.  10/27/12  Yes Historical Provider, MD  metoprolol succinate (TOPROL-XL) 25 MG 24 hr tablet Take 1 tablet (25 mg total) by mouth daily. 02/26/14  Yes Rogelia Mire, NP  nitroGLYCERIN (NITROSTAT) 0.4 MG SL tablet Place 1 tablet (0.4 mg total) under the tongue every 5 (five) minutes as needed for chest pain. 02/26/14  Yes Rogelia Mire, NP  Omega-3 Fatty Acids (FISH OIL) 1000 MG CAPS Take by mouth daily.   Yes Historical Provider, MD  ticagrelor (BRILINTA) 90 MG TABS tablet Take 1 tablet (90 mg total) by mouth 2 (two) times daily. 02/26/14  Yes Rogelia Mire, NP    Review of Systems  Overall doing well w/o limitations.  Bruises easily.  All other systems reviewed and are otherwise negative except as noted above.  Physical Exam  VS:  BP 122/72 mmHg  Pulse 60  Ht 5\' 9"  (1.753 m)  Wt 198 lb (89.812 kg)  BMI 29.23 kg/m2 , BMI Body mass index is 29.23 kg/(m^2). GEN: Well nourished, well developed, in no acute distress. HEENT: normal. Neck: Supple, no JVD, carotid bruits, or masses. Cardiac: RRR, no murmurs, rubs, or gallops. No clubbing, cyanosis, edema.  Radials/DP/PT 2+ and equal bilaterally.  Respiratory:  Respirations regular and  unlabored, clear to auscultation bilaterally. GI: Soft, nontender, nondistended, BS + x 4. MS: no deformity or atrophy. Skin: warm and dry, no rash. Neuro:  Strength and sensation are intact. Psych: Normal affect.  Accessory Clinical Findings  ECG - rsr, 60, no acute st/t changes.  Assessment & Plan  1.  CAD: s/p NSTEMI in 01/2014 with DES to the LCX.  He remains on asa, brilinta, statin, bb, and prn nitrates, and has been doing well w/o chest pain or dyspnea.  Come August he will have completed one full year of DAPT.  He is tolerating it and affording it w/o difficulty.  We may want to consider prolonged brilinta therapy @ 60 mg bid beyond August given PEGASUS  data - defer to Dr. Fletcher Anon.  He is very active w/o limitations in activities.  We discussed his exercise regimen, which often focuses on heavy weights for under 10 reps.  I recommended lighter weight with more reps.  2.  HL:  LDL 50 in 06/2014 with nl LFT's.  Cont statin therapy.  3.  Dispo:  F/U with Dr. Fletcher Anon in six months or sooner if necessary.  Murray Hodgkins, NP 12/09/2014, 12:25 PM

## 2015-01-13 ENCOUNTER — Other Ambulatory Visit: Payer: Self-pay | Admitting: Nurse Practitioner

## 2015-02-24 ENCOUNTER — Telehealth: Payer: Self-pay | Admitting: Cardiovascular Disease

## 2015-02-24 NOTE — Telephone Encounter (Signed)
Thi from [employer] for Prescribing RX Ignacia Bayley supervising MD.

## 2015-04-10 ENCOUNTER — Other Ambulatory Visit: Payer: Self-pay | Admitting: Nurse Practitioner

## 2015-06-28 ENCOUNTER — Ambulatory Visit (INDEPENDENT_AMBULATORY_CARE_PROVIDER_SITE_OTHER): Payer: 59 | Admitting: Cardiovascular Disease

## 2015-06-28 ENCOUNTER — Encounter: Payer: Self-pay | Admitting: Cardiovascular Disease

## 2015-06-28 VITALS — BP 127/85 | HR 78 | Ht 69.0 in | Wt 202.2 lb

## 2015-06-28 DIAGNOSIS — I2510 Atherosclerotic heart disease of native coronary artery without angina pectoris: Secondary | ICD-10-CM | POA: Diagnosis not present

## 2015-06-28 DIAGNOSIS — E785 Hyperlipidemia, unspecified: Secondary | ICD-10-CM

## 2015-06-28 NOTE — Assessment & Plan Note (Signed)
Lab Results  Component Value Date   CHOL 111 06/08/2014   HDL 46 06/08/2014   LDLCALC 50 06/08/2014   TRIG 73 06/08/2014   Continue treatment with atorvastatin.

## 2015-06-28 NOTE — Progress Notes (Signed)
HPI  54 y/o male who is here today for a follow-up visit regarding coronary artery disease. He was admitted to [facility] in October, 2015 with  NSTEMI.  He underwent cath revealing severe LCX dzs and otw non-obs dzs.  The LCX was successfully treated with a DES. EF was 50%.      He denies chest pain, palpitations, pnd, orthopnea, n, v, dizziness, syncope, edema, weight gain, or early satiety.  He has been off metoprolol due to bradycardia.  Allergies  Allergen Reactions  . Prednisone     jittery     Current Outpatient Prescriptions on File Prior to Visit  Medication Sig Dispense Refill  . aspirin 81 MG tablet Take 81 mg by mouth daily.    Marland Kitchen atorvastatin (LIPITOR) 40 MG tablet Take 1 tablet by mouth  daily 90 tablet 3  . azelastine (ASTELIN) 0.1 % nasal spray 1 spray as needed.     Marland Kitchen BRILINTA 90 MG TABS tablet Take 1 tablet by mouth  twice a day 180 tablet 0  . cholecalciferol (VITAMIN D) 1000 UNITS tablet Take 1,000 Units by mouth 2 (two) times daily.    Marland Kitchen levothyroxine (SYNTHROID, LEVOTHROID) 88 MCG tablet Take 88 mcg by mouth daily before breakfast.     . nitroGLYCERIN (NITROSTAT) 0.4 MG SL tablet Place 1 tablet (0.4 mg total) under the tongue every 5 (five) minutes as needed for chest pain. 25 tablet 3  . Omega-3 Fatty Acids (FISH OIL) 1000 MG CAPS Take by mouth daily.     No current facility-administered medications on file prior to visit.     Past Medical History  Diagnosis Date  . Hypothyroidism   . Hodgkin's lymphoma ([place])   . Coronary artery disease     a. 01/2014 NSTEMI/PCI: LM 20ost, LAD 20p, D1/2 nl, D2 small/nl, LCX 90p (3.5x12 Resolute Integrity DES), OM1/2/3 nl, LPL nl, RCA non-dominant, min irregs, EF 50%; b. 01/2014 Echo: EF 50-55%, diast dysfxn, mild MR, mildly dil LA.  Marland Kitchen Hyperlipidemia      Past Surgical History  Procedure Laterality Date  . Appendectomy  1977  . Neck lesion biopsy  2001  . Cardiac catheterization  2015    x1 stent  . Coronary angioplasty         Family History  Problem Relation Age of Onset  . Lung cancer Mother   . Heart attack Mother   . Cancer Father      Social History   Social History  . Marital Status: Married    Spouse Name: N/A  . Number of Children: N/A  . Years of Education: N/A   Occupational History  . Not on file.   Social History Main Topics  . Smoking status: Never Smoker   . Smokeless tobacco: Never Used  . Alcohol Use: No  . Drug Use: No  . Sexual Activity: Not on file   Other Topics Concern  . Not on file   Social History Narrative      PHYSICAL EXAM   BP 127/85 mmHg  Pulse 78  Ht 5\' 9"  (1.753 m)  Wt 202 lb 4 oz (91.74 kg)  BMI 29.85 kg/m2  Constitutional: He is oriented to person, place, and time. He appears well-developed and well-nourished. No distress.  HENT: No nasal discharge.  Head: Normocephalic and atraumatic.  Eyes: Pupils are equal and round.  No discharge. Neck: Normal range of motion. Neck supple. No JVD present. No thyromegaly present.  Cardiovascular: Normal rate, regular rhythm, normal heart  sounds. Exam reveals no gallop and no friction rub. No murmur heard.  Pulmonary/Chest: Effort normal and breath sounds normal. No stridor. No respiratory distress. He has no wheezes. He has no rales. He exhibits no tenderness.  Abdominal: Soft. Bowel sounds are normal. He exhibits no distension. There is no tenderness. There is no rebound and no guarding.  Musculoskeletal: Normal range of motion. He exhibits no edema and no tenderness.  Neurological: He is alert and oriented to person, place, and time. Coordination normal.  Skin: Skin is warm and dry. No rash noted. He is not diaphoretic. No erythema. No pallor.  Psychiatric: He has a normal mood and affect. His behavior is normal. Judgment and thought content normal.      EKG: Normal sinus rhythm with no significant ST or T wave changes   ASSESSMENT AND PLAN

## 2015-06-28 NOTE — Assessment & Plan Note (Signed)
He has been doing extremely well with no recurrent angina. It has been more than one year since his cardiac event with drug-eluting stent placement. He had only single-vessel uncomplicated coronary artery disease and thus I think his overall risk for recurrent ischemic events is relatively low. Thus, I asked him to stop taking Brilinta After finishing current supplies. Continue aspirin indefinitely.

## 2015-06-28 NOTE — Patient Instructions (Signed)
Medication Instructions: Continue same medications but stop taking Brilinta after finishing current refills.   Labwork: None.   Procedures/Testing: None.   Follow-Up: 1 year with Dr. Fletcher Anon.   Any Additional Special Instructions Will Be Listed Below (If Applicable).

## 2015-07-03 ENCOUNTER — Other Ambulatory Visit: Payer: Self-pay | Admitting: Nurse Practitioner

## 2015-08-24 ENCOUNTER — Telehealth: Payer: Self-pay | Admitting: Cardiovascular Disease

## 2015-08-24 NOTE — Telephone Encounter (Signed)
Urgent care checklist form placed in nurse in box for card clearance  .

## 2015-08-24 NOTE — Telephone Encounter (Signed)
Attempted to speak w/pt regarding clearance. No answer, no VM set up on cell. Left message on home VM to call back.

## 2015-08-24 NOTE — Telephone Encounter (Signed)
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  Printed roi to give patient 06/2015 ov note and ekg.  Please call patient to discuss.

## 2015-08-25 NOTE — Telephone Encounter (Signed)
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 Confirmed with patient that he has never had a GXT or nuclear stress test. Informed pt I will inform Dr. Fletcher Anon and call back. 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

## 2015-08-26 ENCOUNTER — Other Ambulatory Visit: Payer: Self-pay

## 2015-08-26 DIAGNOSIS — Z024 Encounter for examination for driving license: Secondary | ICD-10-CM

## 2015-08-26 NOTE — Telephone Encounter (Signed)
Per verbal from Dr. Fletcher Anon, order pt GXT. Left message on pt home VM. Scheduled for Tuesday, Feb 28, 8:30am Awaiting CB

## 2015-08-29 NOTE — Telephone Encounter (Signed)
Pt called to confirm Feb 28 GXT Reviewed instructions. Pt verbalized understanding.

## 2015-08-30 ENCOUNTER — Ambulatory Visit (INDEPENDENT_AMBULATORY_CARE_PROVIDER_SITE_OTHER): Payer: 59

## 2015-08-30 ENCOUNTER — Telehealth: Payer: Self-pay | Admitting: *Deleted

## 2015-08-30 DIAGNOSIS — Z021 Encounter for pre-employment examination: Secondary | ICD-10-CM | POA: Diagnosis not present

## 2015-08-30 DIAGNOSIS — Z024 Encounter for examination for driving license: Secondary | ICD-10-CM

## 2015-08-30 LAB — EXERCISE TOLERANCE TEST
CHL CUP MPHR: 167 {beats}/min
Estimated workload: 12.2 METS
Exercise duration (min): 10 min
Exercise duration (sec): 19 s
Peak HR: 142 {beats}/min
Percent HR: 85 %
Rest HR: 88 {beats}/min

## 2015-08-30 NOTE — Telephone Encounter (Signed)
Patient also wants a copy of results mailed to him.

## 2015-08-30 NOTE — Telephone Encounter (Signed)
When stress test results come in he would like for you to fax them to [facility] fax [phone] along with ekg. Thanks!

## 2015-08-31 NOTE — Telephone Encounter (Signed)
Left message on pt home VM. Per pt request, EKG and GXT results faxed to [facility], [phone]. Copy mailed to pt

## 2015-09-05 ENCOUNTER — Telehealth: Payer: Self-pay | Admitting: Cardiovascular Disease

## 2015-09-05 NOTE — Telephone Encounter (Signed)
Letter faxed to [phone]. Left message on pt home VM of fax

## 2015-09-05 NOTE — Telephone Encounter (Signed)
Pt calling stating he needs a clearance letter for [employer] license  Please fax to : [phone]  Pt needs done asap please.

## 2015-10-04 ENCOUNTER — Other Ambulatory Visit: Payer: Self-pay

## 2015-10-04 ENCOUNTER — Telehealth: Payer: Self-pay | Admitting: Cardiovascular Disease

## 2015-10-04 NOTE — Telephone Encounter (Signed)
S/w pt has a pinched nerve/spasm in his neck. Reports he has dealt with this for 40 years. In the past, he has taken Aleve. He has been taking Tylenol x 2-3 days without much improvement.  Pt takes 81mg  aspirin. Last dose of brilinta was 2 weeks ago as he is more than a year since cardiac event and stent placement. He would like to know if/what NSAID he can take.

## 2015-10-04 NOTE — Telephone Encounter (Signed)
Pt states he thinks he has a pinched nerve in his neck, and would like to know what he can take for pain. Please call and advise

## 2015-10-04 NOTE — Telephone Encounter (Signed)
Shot term Aleve is fine given that he is no longer on Brilinta.

## 2015-10-05 NOTE — Telephone Encounter (Signed)
S/w pt regarding MD recommendations for Aleve. Pt verbalized understanding and is appreciative of the call.

## 2016-01-04 ENCOUNTER — Telehealth: Payer: Self-pay | Admitting: Cardiovascular Disease

## 2016-01-04 NOTE — Telephone Encounter (Signed)
S/w pt who reports difficulty sleeping, anxiety and dizziness for "several weeks off and on". He was prescribed lexapro and klonopin one week ago by Dr. Hall Busing.  He is not taking lexapro any more as he felt this one "got me riled up" but still taking klonopin. He called to ask if he could take 250mg  magnesium as he read on the internet this might be good for anxiety. Advised pt to contact Dr. Hall Busing to discuss sx when taking new medications he prescribed and to not take magnesium without current labs showing mag level. BP was 124/80 at OV w/PCP but thinks BP may be lower than this.  Advised pt to monitor BP for 5 days and call if he is hypertensive or hypotensive. He understands to stay hydrated and thinks dehydration may be the cause of some of his dizziness. Pt verbalized understanding and is agreeable w/plan. He had no further questions.

## 2016-01-04 NOTE — Telephone Encounter (Signed)
Patient is lightheaded and wants to be seen just in case not just stress at work .  Please call to discuss.

## 2016-01-04 NOTE — Telephone Encounter (Signed)
Please call patient and let him know what Dr. Fletcher Anon said.

## 2016-01-04 NOTE — Telephone Encounter (Signed)
Patient called and wants to know if he can take magnesium 250? He is having a lot of stress at work and not sleeping well.

## 2016-01-07 ENCOUNTER — Other Ambulatory Visit: Payer: Self-pay | Admitting: Nurse Practitioner

## 2016-06-22 ENCOUNTER — Encounter: Payer: Self-pay | Admitting: Cardiovascular Disease

## 2016-06-22 ENCOUNTER — Ambulatory Visit (INDEPENDENT_AMBULATORY_CARE_PROVIDER_SITE_OTHER): Payer: 59 | Admitting: Cardiovascular Disease

## 2016-06-22 VITALS — BP 130/80 | HR 65 | Ht 69.0 in | Wt 199.0 lb

## 2016-06-22 DIAGNOSIS — E785 Hyperlipidemia, unspecified: Secondary | ICD-10-CM | POA: Diagnosis not present

## 2016-06-22 DIAGNOSIS — I2510 Atherosclerotic heart disease of native coronary artery without angina pectoris: Secondary | ICD-10-CM | POA: Diagnosis not present

## 2016-06-22 NOTE — Patient Instructions (Signed)
Medication Instructions: Continue same medications.   Labwork: None.   Procedures/Testing: None.   Follow-Up: 1 year with Dr. Arida.   Any Additional Special Instructions Will Be Listed Below (If Applicable).     If you need a refill on your cardiac medications before your next appointment, please call your pharmacy.   

## 2016-06-22 NOTE — Progress Notes (Signed)
Cardiology Office Note   Date:  06/22/2016   ID:  Steven Bolton, DOB 1960-07-27, MRN [id number]  PCP:  Steven Billet, MD  Cardiologist:   Steven Sacramento, MD   Chief Complaint  Patient presents with  . other    64mo f/u. Pt states he is doing well Reviewed meds with pt verbally.      History of Present Illness: Steven Bolton is a 55 y.o. male who presents for A follow-up visit regarding coronary artery disease. He had non-ST elevation myocardial infarction in October 2015. Cardiac catheterization showed severe one-vessel coronary artery disease affecting the left circumflex. He underwent successful angioplasty and drug-eluting stent placement. Ejection fraction was low normal.  He has been off metoprolol due to bradycardia. He has been doing extremely well with no chest pain or shortness of breath. 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    Past Medical History:  Diagnosis Date  . Coronary artery disease    a. 01/2014 NSTEMI/PCI: LM 20ost, LAD 20p, D1/2 nl, D2 small/nl, LCX 90p (3.5x12 Resolute Integrity DES), OM1/2/3 nl, LPL nl, RCA non-dominant, min irregs, EF 50%; b. 01/2014 Echo: EF 50-55%, diast dysfxn, mild MR, mildly dil LA.  Marland Kitchen Hodgkin's lymphoma ([place])   . Hyperlipidemia   . Hypothyroidism     Past Surgical History:  Procedure Laterality Date  . APPENDECTOMY  1977  . CARDIAC CATHETERIZATION  2015   x1 stent  . CORONARY ANGIOPLASTY    . NECK LESION BIOPSY  2001     Current Outpatient Prescriptions  Medication Sig Dispense Refill  . aspirin 81 MG tablet Take 81 mg by mouth daily.    Marland Kitchen atorvastatin (LIPITOR) 40 MG tablet Take 1 tablet by mouth  daily 90 tablet 3  . azelastine (ASTELIN) 0.1 % nasal spray 1 spray as needed.     . cholecalciferol (VITAMIN D) 1000 UNITS tablet Take 1,000 Units by mouth 2 (two) times daily.    Marland Kitchen escitalopram (LEXAPRO) 10 MG tablet Take 10 mg by mouth daily.    Marland Kitchen levothyroxine (SYNTHROID,  LEVOTHROID) 88 MCG tablet Take 88 mcg by mouth daily before breakfast.     . nitroGLYCERIN (NITROSTAT) 0.4 MG SL tablet Place 1 tablet (0.4 mg total) under the tongue every 5 (five) minutes as needed for chest pain. 25 tablet 3  . Omega-3 Fatty Acids (FISH OIL) 1000 MG CAPS Take by mouth daily.     No current facility-administered medications for this visit.     Allergies:   Prednisone    Social History:  The patient  reports that he has never smoked. He has never used smokeless tobacco. He reports that he does not drink alcohol or use drugs.   Family History:  The patient's family history includes Cancer in his father; Heart attack in his mother; Lung cancer in his mother.    ROS:  Please see the history of present illness.   Otherwise, review of systems are positive for none.   All other systems are reviewed and negative.    PHYSICAL EXAM: VS:  BP 130/80 (BP Location: Left Arm, Patient Position: Sitting, Cuff Size: Normal)   Pulse 65   Ht 5\' 9"  (1.753 m)   Wt 199 lb (90.3 kg)   BMI 29.39 kg/m  , BMI Body mass index is 29.39 kg/m. GEN: Well nourished, well developed, in no acute distress  HEENT: normal  Neck: no JVD, carotid bruits, or masses Cardiac: RRR;  no  rubs, or gallops,no edema . 1/6 SEM in aortic area.  Respiratory:  clear to auscultation bilaterally, normal work of breathing GI: soft, nontender, nondistended, + BS MS: no deformity or atrophy  Skin: warm and dry, no rash Neuro:  Strength and sensation are intact Psych: euthymic mood, full affect   EKG:  EKG is ordered today. The ekg ordered today demonstrates normal sinus rhythm with no significant ST or T wave changes.   Recent Labs: No results found for requested labs within last 8760 hours.    Lipid Panel    Component Value Date/Time   CHOL 111 06/08/2014 0804   TRIG 73 06/08/2014 0804   HDL 46 06/08/2014 0804   LDLCALC 50 06/08/2014 0804      Wt Readings from Last 3 Encounters:  06/22/16 199 lb  (90.3 kg)  06/28/15 202 lb 4 oz (91.7 kg)  12/09/14 198 lb (89.8 kg)      No flowsheet data found.    ASSESSMENT AND PLAN:  1.  Coronary artery disease involving native coronary arteries without angina: He is doing extremely well with no anginal symptoms. Continue medical therapy.  2. Hyperlipidemia: Continue treatment with atorvastatin with a target LDL of less than 70.    Disposition:   FU with me in 1 year  Signed,  Steven Sacramento, MD  06/22/2016 3:11 PM    [employer]

## 2016-08-22 ENCOUNTER — Telehealth: Payer: Self-pay | Admitting: Cardiovascular Disease

## 2016-08-22 NOTE — Telephone Encounter (Signed)
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 I will check w/pt to confirm they are not requiring GXT this year.  Left message on pt home VM.

## 2016-08-22 NOTE — Telephone Encounter (Signed)
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 Please fax to [facility] [phone]

## 2016-08-23 ENCOUNTER — Telehealth: Payer: Self-pay | Admitting: Cardiovascular Disease

## 2016-08-23 NOTE — Telephone Encounter (Signed)
Awaiting letter from Dr. Fletcher Anon. Left message on pt home VM to make aware.

## 2016-08-23 NOTE — Telephone Encounter (Signed)
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 It also needs to state that he is ok to drive. Also, the year needs to be on the letter of when the stress test was done.

## 2016-08-27 ENCOUNTER — Telehealth: Payer: Self-pay | Admitting: Cardiovascular Disease

## 2016-08-27 NOTE — Telephone Encounter (Signed)
He is fine to drive from a cardiac standpoint. Last stress test in 08/2015 was normal.

## 2016-08-27 NOTE — Telephone Encounter (Signed)
Per Dr. Fletcher Anon:  "He is fine to drive from a cardiac standpoint. Last stress test in 08/2015 was normal. "  S/w pt who asks I fax clearance along w/2017 GXT results to [employer], Attn: Towanda Malkin  fax # [phone]. Letter sent.

## 2016-08-27 NOTE — Telephone Encounter (Signed)
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  [employer] license expire 123XX123 but employer would like yearly letter. He had a normal GXT March 2017. Pt OV Dec 2017; f/u one year.  Routed to Dr. Fletcher Anon.

## 2016-08-27 NOTE — Telephone Encounter (Signed)
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 So he needs this done soon.  hopefully be the end of the week he is hoping to get this done.

## 2017-01-09 ENCOUNTER — Other Ambulatory Visit: Payer: Self-pay | Admitting: Cardiovascular Disease

## 2017-07-01 ENCOUNTER — Ambulatory Visit (INDEPENDENT_AMBULATORY_CARE_PROVIDER_SITE_OTHER): Payer: 59

## 2017-07-01 ENCOUNTER — Encounter: Payer: Self-pay | Admitting: Nurse Practitioner

## 2017-07-01 ENCOUNTER — Ambulatory Visit (INDEPENDENT_AMBULATORY_CARE_PROVIDER_SITE_OTHER): Payer: 59 | Admitting: Nurse Practitioner

## 2017-07-01 VITALS — BP 132/86 | HR 75 | Ht 69.5 in | Wt 212.5 lb

## 2017-07-01 DIAGNOSIS — I2510 Atherosclerotic heart disease of native coronary artery without angina pectoris: Secondary | ICD-10-CM | POA: Diagnosis not present

## 2017-07-01 DIAGNOSIS — E785 Hyperlipidemia, unspecified: Secondary | ICD-10-CM | POA: Diagnosis not present

## 2017-07-01 DIAGNOSIS — R030 Elevated blood-pressure reading, without diagnosis of hypertension: Secondary | ICD-10-CM

## 2017-07-01 NOTE — Progress Notes (Signed)
Office Visit    Patient Name: Steven Bolton Date of Encounter: 07/01/2017  Primary Care Provider:  Albina Billet, MD Primary Cardiologist:  Kathlyn Sacramento, MD  Chief Complaint    56 year old male with a history of CAD status post non-STEMI in August 2015 with circumflex stenting, hyperlipidemia, hypothyroidism, and history of Hodgkin's lymphoma who presents for annual follow-up.  Past Medical History    Past Medical History:  Diagnosis Date  . Coronary artery disease    a. 01/2014 NSTEMI/PCI: LM 20ost, LAD 20p, D1/2 nl, D2 small/nl, LCX 90p (3.5x12 Resolute Integrity DES), OM1/2/3 nl, LPL nl, RCA non-dominant, min irregs, EF 50%; b. 01/2014 Echo: EF 50-55%, diast dysfxn, mild MR, mildly dil LA; c. 08/2015 ETT: no ECG changes.  . Hodgkin's lymphoma ([place])   . Hyperlipidemia   . Hypothyroidism    Past Surgical History:  Procedure Laterality Date  . APPENDECTOMY  1977  . CARDIAC CATHETERIZATION  2015   x1 stent  . CORONARY ANGIOPLASTY    . NECK LESION BIOPSY  2001    Allergies  Allergies  Allergen Reactions  . Prednisone     jittery    History of Present Illness    56 year old male with a prior history of CAD status post non-STEMI in August 2015 with circumflex stenting at that time.  Other history includes hyperlipidemia, hypothyroidism, and history of Hodgkin's lymphoma.  He was last seen in clinic just over a year ago and has continued to do well.  He is exercising 3 times a week.  He has increased his cardio and is now doing cardio 45 minutes with each exercise episode.  He has tolerated this well and denies chest pain, palpitations, dyspnea, PND, orthopnea, dizziness, syncope, edema, or early satiety.  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  Home Medications    Prior to Admission medications   Medication Sig Start Date End Date Taking? Authorizing Provider  aspirin 81 MG tablet Take 81 mg by mouth daily.   Yes [provider]   atorvastatin (LIPITOR) 40 MG tablet TAKE 1 TABLET BY MOUTH  DAILY 01/10/17  Yes Wellington Hampshire, MD  azelastine (ASTELIN) 0.1 % nasal spray 1 spray as needed.  02/12/14  Yes [provider]  cholecalciferol (VITAMIN D) 1000 UNITS tablet Take 1,000 Units by mouth 2 (two) times daily.   Yes [provider]  Cyanocobalamin (VITAMIN B 12) 100 MCG LOZG Take by mouth daily.   Yes [provider]  escitalopram (LEXAPRO) 10 MG tablet Take 10 mg by mouth daily. 06/01/16  Yes [provider]  levothyroxine (SYNTHROID, LEVOTHROID) 88 MCG tablet Take 88 mcg by mouth daily before breakfast.  10/27/12  Yes [provider]  nitroGLYCERIN (NITROSTAT) 0.4 MG SL tablet Place 1 tablet (0.4 mg total) under the tongue every 5 (five) minutes as needed for chest pain. 02/26/14  Yes Theora Gianotti, NP  Omega-3 Fatty Acids (FISH OIL) 1000 MG CAPS Take by mouth daily.   Yes [provider]    Review of Systems    He denies chest pain, palpitations, dyspnea, pnd, orthopnea, n, v, dizziness, syncope, edema, weight gain, or early satiety.  All other systems reviewed and are otherwise negative except as noted above.  Physical Exam    VS:  BP 132/86 (BP Location: Left Arm, Patient Position: Sitting, Cuff Size: Normal)   Pulse 75   Ht 5' 9.5" (1.765 m)   Wt 212  lb 8 oz (96.4 kg)   BMI 30.93 kg/m  , BMI Body mass index is 30.93 kg/m. GEN: Well nourished, well developed, in no acute distress.  HEENT: normal.  Neck: Supple, no JVD, carotid bruits, or masses. Cardiac: RRR, no murmurs, rubs, or gallops. No clubbing, cyanosis, edema.  Radials/DP/PT 2+ and equal bilaterally.  Respiratory:  Respirations regular and unlabored, clear to auscultation bilaterally. GI: Soft, nontender, nondistended, BS + x 4. MS: no deformity or atrophy. Skin: warm and dry, no rash. Neuro:  Strength and sensation are intact. Psych: Normal affect.  Accessory Clinical Findings      ECG -regular sinus rhythm, 75, no acute ST or T changes.  Assessment & Plan    1.  Coronary artery disease: Status post non-STEMI in 2015 with circumflex stenting.  He continues to do well and is exercising 3 times a week including 45 minutes of cardio on each exercise day.  He remains on aspirin and statin therapy.  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  We will be able to perform this today.  2.  Hyperlipidemia: He remains on statin therapy.  Last LDL we have on record is from 2015, at which time it was 50.  Lipids have been followed by primary care.  3.  Elevated blood pressure: Blood pressure today is mildly elevated at 132/86.  Patient says he has concerns that his pressure has been running higher, especially at doctor's visits.  He does have a cuff at home.  I have asked him to check his blood pressure on a regular basis over the next 2-3 weeks so that we may develop a trend.  If he is truly trending high, we can consider adding losartan therapy.  He will call us with results.  4.  Hypothyroidism: On Synthroid and followed by primary care.  5.  Disposition: Exercise treadmill test today.  Follow-up in clinic in 1 year or sooner if necessary.  Murray Hodgkins, NP 07/01/2017, 9:22 AM

## 2017-07-01 NOTE — Patient Instructions (Signed)
Medication Instructions: - Your physician recommends that you continue on your current medications as directed. Please refer to the Current Medication list given to you today.  Labwork: - none ordered  Procedures/Testing: - Your physician has requested that you have an exercise tolerance test. For further information please visit [email or web address]. Please also follow instruction sheet, as given.  Follow-Up: - Your physician wants you to follow-up in: 1 year with Dr. Fletcher Anon.  You will receive a reminder letter in the mail two months in advance. If you don't receive a letter, please call our office to schedule the follow-up appointment.  Any Additional Special Instructions Will Be Listed Below (If Applicable).     If you need a refill on your cardiac medications before your next appointment, please call your pharmacy.

## 2017-07-08 LAB — EXERCISE TOLERANCE TEST
CSEPED: 9 min
CSEPEDS: 0 s
CSEPHR: 86 %
Estimated workload: 10.1 METS
MPHR: 165 {beats}/min
Peak HR: 142 {beats}/min
Rest HR: 91 {beats}/min

## 2017-12-02 ENCOUNTER — Other Ambulatory Visit: Payer: Self-pay | Admitting: Cardiovascular Disease

## 2018-07-15 ENCOUNTER — Ambulatory Visit: Payer: 59 | Admitting: Cardiovascular Disease

## 2018-07-16 ENCOUNTER — Other Ambulatory Visit: Payer: Self-pay | Admitting: Cardiovascular Disease

## 2018-07-17 NOTE — Progress Notes (Signed)
Cardiology Office Note Date:  07/18/2018  Patient ID:  Steven, Bolton [birth year], MRN [id number] PCP:  Albina Billet, MD  Cardiologist:  Dr. Fletcher Anon, MD    Chief Complaint: Follow-up  History of Present Illness: Steven Bolton is a 58 y.o. male with history of CAD with non-STEMI and 01/2014 status post PCI to the LCx, Hodgkin's lymphoma, hyperlipidemia, and hypothyroidism who presents for follow-up of his CAD.  Patient was admitted to the hospital in 01/2014 with a non-STEMI.  Cardiac cath showed left main 20% stenosis, proximal LAD 20% stenosis, proximal LCx 90% stenosis status post PCI/DES.  EF by echo at that time was noted to be 50%.  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  The patient demonstrated appropriate blood pressure response.  He exercised for 9 minutes with a peak heart rate of 142 bpm.  He achieved 10.1 METs.  He was last seen in the office on 07/01/2017 and was doing well from a cardiac perspective.  Labs: 10/2017- TC 126, HDL 45, LDL 62, potassium 4.6, serum creatinine 1.06, Hgb 16.7, TSH 5.21, free T4 1.19  He comes in doing very well today.  He indicates over the past year he has felt "great."  He denies any chest pain, shortness of breath, palpitations, dizziness, presyncope, syncope, lower extremity swelling, abdominal distention, orthopnea, PND, or early satiety.  He continues to drive trucks.  In his time off he tries to exercise for at least 3 miles and has not had any symptoms concerning for angina with this.  Blood pressure typically runs in the 683M systolic.  He is dealing with a URI today and feels like that has contributed to his mildly elevated blood pressure.  He has not had any falls since he was last seen.  He is compliant with all medications.  He does not have any issues or concerns today.   Past Medical History:    Diagnosis Date  . Coronary artery disease    a. 01/2014 NSTEMI/PCI: LM 20ost, LAD 20p, D1/2 nl, D2 small/nl, LCX 90p (3.5x12 Resolute Integrity DES), OM1/2/3 nl, LPL nl, RCA non-dominant, min irregs, EF 50%; b. 01/2014 Echo: EF 50-55%, diast dysfxn, mild MR, mildly dil LA; c. 08/2015 ETT: no ECG changes.  . Hodgkin's lymphoma ([place])   . Hyperlipidemia   . Hypothyroidism     Past Surgical History:  Procedure Laterality Date  . APPENDECTOMY  1977  . CARDIAC CATHETERIZATION  2015   x1 stent  . CORONARY ANGIOPLASTY    . NECK LESION BIOPSY  2001    Current Meds  Medication Sig  . aspirin 81 MG tablet Take 81 mg by mouth daily.  Marland Kitchen atorvastatin (LIPITOR) 40 MG tablet TAKE 1 TABLET BY MOUTH  DAILY  . azelastine (ASTELIN) 0.1 % nasal spray 1 spray as needed.   . cholecalciferol (VITAMIN D) 1000 UNITS tablet Take 1,000 Units by mouth 2 (two) times daily.  . Cyanocobalamin (VITAMIN B 12) 100 MCG LOZG Take by mouth daily.  Marland Kitchen escitalopram (LEXAPRO) 10 MG tablet Take 10 mg by mouth daily.  Marland Kitchen levothyroxine (SYNTHROID, LEVOTHROID) 88 MCG tablet Take 88 mcg by mouth daily before breakfast.   . nitroGLYCERIN (NITROSTAT) 0.4 MG SL tablet Place 1 tablet (0.4 mg total) under the tongue every 5 (five) minutes as needed for chest pain.  . Omega-3 Fatty  Acids (FISH OIL) 1000 MG CAPS Take by mouth daily.    Allergies:   Prednisone   Social History:  The patient  reports that he has never smoked. He has never used smokeless tobacco. He reports that he does not drink alcohol or use drugs.   Family History:  The patient's family history includes Cancer in his father; Heart attack in his mother; Lung cancer in his mother.  ROS:   Review of Systems  Constitutional: Negative for chills, diaphoresis, fever, malaise/fatigue and weight loss.  HENT: Negative for congestion.   Eyes: Negative for discharge and redness.  Respiratory: Negative for cough, hemoptysis, sputum production, shortness of breath and  wheezing.   Cardiovascular: Negative for chest pain, palpitations, orthopnea, claudication, leg swelling and PND.  Gastrointestinal: Negative for abdominal pain, blood in stool, heartburn, melena, nausea and vomiting.  Genitourinary: Negative for hematuria.  Musculoskeletal: Negative for falls and myalgias.  Skin: Negative for rash.  Neurological: Negative for dizziness, tingling, tremors, sensory change, speech change, focal weakness, loss of consciousness and weakness.  Endo/Heme/Allergies: Does not bruise/bleed easily.  Psychiatric/Behavioral: Negative for substance abuse. The patient is not nervous/anxious.   All other systems reviewed and are negative.    PHYSICAL EXAM:  VS:  BP (!) 150/92 (BP Location: Left Arm, Patient Position: Sitting, Cuff Size: Normal)   Pulse 83   Ht 5' 9.5" (1.765 m)   Wt 213 lb (96.6 kg)   BMI 31.00 kg/m  BMI: Body mass index is 31 kg/m.  Physical Exam  Constitutional: He is oriented to person, place, and time. He appears well-developed and well-nourished.  HENT:  Head: Normocephalic and atraumatic.  Eyes: Right eye exhibits no discharge. Left eye exhibits no discharge.  Neck: Normal range of motion. No JVD present.  Cardiovascular: Normal rate, regular rhythm, S1 normal, S2 normal and normal heart sounds. Exam reveals no distant heart sounds, no friction rub, no midsystolic click and no opening snap.  No murmur heard. Pulses:      Posterior tibial pulses are 2+ on the right side and 2+ on the left side.  Pulmonary/Chest: Effort normal and breath sounds normal. No respiratory distress. He has no decreased breath sounds. He has no wheezes. He has no rales. He exhibits no tenderness.  Abdominal: Soft. He exhibits no distension. There is no abdominal tenderness.  Musculoskeletal:        General: No edema.  Neurological: He is alert and oriented to person, place, and time.  Skin: Skin is warm and dry. No cyanosis. Nails show no clubbing.  Psychiatric:  He has a normal mood and affect. His speech is normal and behavior is normal. Judgment and thought content normal.     EKG:  Was ordered and interpreted by me today. Shows NSR, 83 bpm, no acute st/t changes   Recent Labs: No results found for requested labs within last 8760 hours.  No results found for requested labs within last 8760 hours.   CrCl cannot be calculated (Patient's most recent lab result is older than the maximum 21 days allowed.).   Wt Readings from Last 3 Encounters:  07/18/18 213 lb (96.6 kg)  07/01/17 212 lb 8 oz (96.4 kg)  06/22/16 199 lb (90.3 kg)     Other studies reviewed: Additional studies/records reviewed today include: summarized above  ASSESSMENT AND PLAN:  1. CAD involving the native coronary arteries without angina: He is doing well without any symptoms concerning for angina.  Continue aspirin and Lipitor.  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  Continue aggressive risk factor modification and secondary prevention.  2. Elevated blood pressure without diagnosis of hypertension: Blood pressure initially noted to be 150/92 with recheck blood pressure being 138/88.  Patient indicates he is dealing with a URI and blood pressure at home typically runs in the 833X systolic.  Patient's last visit with his PCP in 10/2017 demonstrated a blood pressure of 128/76.  I have advised the patient to keep an eye on his blood pressure over the next 2 to 3 weeks and if he notices a trend and elevated blood pressure readings he will contact us.  3. Hyperlipidemia: Most recent LDL at goal of 62 from 10/2017.  Remains on Lipitor 40 mg daily.   Disposition: F/u with Dr. 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  Current medicines are reviewed at length with the patient today.  The patient did not have any concerns regarding medicines.  Signed, Christell Faith, PA-C 07/18/2018 3:05 PM     [place], [place] [address] [phone]

## 2018-07-18 ENCOUNTER — Encounter: Payer: Self-pay | Admitting: Physician Assistant

## 2018-07-18 ENCOUNTER — Ambulatory Visit (INDEPENDENT_AMBULATORY_CARE_PROVIDER_SITE_OTHER): Payer: 59 | Admitting: Physician Assistant

## 2018-07-18 VITALS — BP 150/92 | HR 83 | Ht 69.5 in | Wt 213.0 lb

## 2018-07-18 DIAGNOSIS — R030 Elevated blood-pressure reading, without diagnosis of hypertension: Secondary | ICD-10-CM

## 2018-07-18 DIAGNOSIS — I2510 Atherosclerotic heart disease of native coronary artery without angina pectoris: Secondary | ICD-10-CM | POA: Diagnosis not present

## 2018-07-18 DIAGNOSIS — E785 Hyperlipidemia, unspecified: Secondary | ICD-10-CM

## 2018-07-18 NOTE — Patient Instructions (Signed)
Medication Instructions:  No changes If you need a refill on your cardiac medications before your next appointment, please call your pharmacy.   Lab work: None ordered  Testing/Procedures: Your physician has requested that you have an exercise tolerance test in December. For further information please visit [email or web address]. Please also follow instruction sheet, as given. This will take place at the [place] office.  Do not drink or eat foods with caffeine for 24 hours before the test. (Chocolate, coffee, tea, or energy drinks)  If you use an inhaler, bring it with you to the test.  Do not smoke for 4 hours before the test.  Wear comfortable shoes and clothing.   Follow-Up: At [facility], you and your health needs are our priority.  As part of our continuing mission to provide you with exceptional heart care, we have created designated Provider Care Teams.  These Care Teams include your primary Cardiologist (physician) and Advanced Practice Providers (APPs -  Physician Assistants and Nurse Practitioners) who all work together to provide you with the care you need, when you need it. You will need a follow up appointment in 12 months.  Please call our office 2 months in advance to schedule this appointment.  You may see Kathlyn Sacramento, MD or one of the following Advanced Practice Providers on your designated Care Team:   Murray Hodgkins, NP Christell Faith, PA-C . Marrianne Mood, PA-C

## 2018-07-23 ENCOUNTER — Other Ambulatory Visit: Payer: Self-pay | Admitting: Cardiovascular Disease

## 2018-07-23 MED ORDER — NITROGLYCERIN 0.4 MG SL SUBL: 0.4000 mg | SUBLINGUAL_TABLET | SUBLINGUAL | 1 refills | Status: AC | PRN

## 2018-07-23 NOTE — Telephone Encounter (Signed)
rx sent into pharmacy

## 2018-07-23 NOTE — Telephone Encounter (Signed)
°*  STAT* If patient is at the pharmacy, call can be transferred to refill team.   1. Which medications need to be refilled? (please list name of each medication and dose if known)  nitroglycerin (NITROSTAT) 0.4 MG - 1 tablet as needed  2. Which pharmacy/location (including street and city if local pharmacy) is medication to be sent to? [facility] in [place]  3. Do they need a 30 day or 90 day supply? 30 day or however many that can be sent - patient does not use often

## 2018-07-30 ENCOUNTER — Telehealth: Payer: Self-pay | Admitting: Cardiovascular Disease

## 2018-07-30 DIAGNOSIS — I2510 Atherosclerotic heart disease of native coronary artery without angina pectoris: Secondary | ICD-10-CM

## 2018-07-30 NOTE — Telephone Encounter (Signed)
Best to contact home number now [phone]

## 2018-07-30 NOTE — Telephone Encounter (Signed)
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  Patient notified we would need an order for Echo .  Please advise scheduling.    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

## 2018-07-30 NOTE — Telephone Encounter (Signed)
Patient returning call.

## 2018-07-30 NOTE — Telephone Encounter (Signed)
Left a message for the patient to call back.  

## 2018-07-31 NOTE — Telephone Encounter (Signed)
Echo order entered.  Routing to scheduling to schedule.

## 2018-07-31 NOTE — Telephone Encounter (Signed)
No answer. Left message to call back.    Routing to [place] who saw patient earlier this month to verify if ok to order echo.

## 2018-07-31 NOTE — Telephone Encounter (Signed)
lmov to schedule  °

## 2018-07-31 NOTE — Telephone Encounter (Signed)
Ok to perform complete echo.

## 2018-08-01 ENCOUNTER — Ambulatory Visit (INDEPENDENT_AMBULATORY_CARE_PROVIDER_SITE_OTHER): Payer: 59

## 2018-08-01 DIAGNOSIS — I2510 Atherosclerotic heart disease of native coronary artery without angina pectoris: Secondary | ICD-10-CM | POA: Diagnosis not present

## 2019-03-28 ENCOUNTER — Other Ambulatory Visit: Payer: Self-pay | Admitting: Cardiovascular Disease

## 2019-06-15 ENCOUNTER — Telehealth: Payer: Self-pay | Admitting: Cardiovascular Disease

## 2019-06-15 NOTE — Telephone Encounter (Signed)
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  There is an order that was previously placed in [place].

## 2019-06-15 NOTE — Telephone Encounter (Signed)
Lmov to schedule ETT and fu

## 2019-06-15 NOTE — Telephone Encounter (Signed)
Patient calling Has a DOT physical deadline coming up Would like to clarify with nurse which tests he will need and if he will need an appointment Please call

## 2019-06-19 ENCOUNTER — Other Ambulatory Visit
Admission: RE | Admit: 2019-06-19 | Discharge: 2019-06-19 | Disposition: A | Discharge status: Home / Self Care | Payer: 59 | Source: Ambulatory Visit | Attending: Physician Assistant | Admitting: Physician Assistant

## 2019-06-19 DIAGNOSIS — Z20828 Contact with and (suspected) exposure to other viral communicable diseases: Secondary | ICD-10-CM | POA: Insufficient documentation

## 2019-06-19 DIAGNOSIS — Z01812 Encounter for preprocedural laboratory examination: Secondary | ICD-10-CM | POA: Insufficient documentation

## 2019-06-19 LAB — SARS CORONAVIRUS 2 (TAT 6-24 HRS): SARS Coronavirus 2: NEGATIVE

## 2019-06-22 ENCOUNTER — Ambulatory Visit (INDEPENDENT_AMBULATORY_CARE_PROVIDER_SITE_OTHER): Payer: 59

## 2019-06-22 ENCOUNTER — Other Ambulatory Visit: Payer: Self-pay

## 2019-06-22 DIAGNOSIS — I2510 Atherosclerotic heart disease of native coronary artery without angina pectoris: Secondary | ICD-10-CM

## 2019-06-25 LAB — EXERCISE TOLERANCE TEST
Estimated workload: 11.3 METS
Exercise duration (min): 9 min
Exercise duration (sec): 44 s
MPHR: 163 {beats}/min
Peak HR: 150 {beats}/min
Percent HR: 92 %
RPE: 17
Rest HR: 98 {beats}/min

## 2019-06-29 ENCOUNTER — Telehealth: Payer: Self-pay

## 2019-06-29 NOTE — Telephone Encounter (Signed)
Attempted to call patient. LMTCB 06/29/2019

## 2019-06-29 NOTE — Telephone Encounter (Signed)
-----   Message from Rise Mu, PA-C sent at 06/25/2019 11:56 AM EST ----- Treadmill stress test showed excellent exercise capacity with an exercise duration of 9 minutes and 44 seconds achieving a maximum workload of 11.3 METS.  Patient demonstrated adequate and normal BP and heart rate response to exercise.  There were no significant arrhythmias or ST segment changes.  Overall, very reassuring and a normal stress test.  Follow-up as planned next month.

## 2019-07-06 NOTE — Telephone Encounter (Signed)
Patient wants results released to [place]    [phone] tele  Confirmed fax is [phone]

## 2019-07-30 ENCOUNTER — Encounter: Payer: Self-pay | Admitting: Cardiovascular Disease

## 2019-07-30 ENCOUNTER — Ambulatory Visit (INDEPENDENT_AMBULATORY_CARE_PROVIDER_SITE_OTHER): Payer: 59 | Admitting: Cardiovascular Disease

## 2019-07-30 ENCOUNTER — Other Ambulatory Visit: Payer: Self-pay

## 2019-07-30 VITALS — BP 120/80 | HR 76 | Ht 69.5 in | Wt 215.5 lb

## 2019-07-30 DIAGNOSIS — I2510 Atherosclerotic heart disease of native coronary artery without angina pectoris: Secondary | ICD-10-CM

## 2019-07-30 DIAGNOSIS — E785 Hyperlipidemia, unspecified: Secondary | ICD-10-CM

## 2019-07-30 NOTE — Patient Instructions (Signed)
Medication Instructions:  Your physician recommends that you continue on your current medications as directed. Please refer to the Current Medication list given to you today.  *If you need a refill on your cardiac medications before your next appointment, please call your pharmacy*  Lab Work: None ordered If you have labs (blood work) drawn today and your tests are completely normal, you will receive your results only by: Marland Kitchen MyChart Message (if you have MyChart) OR . A paper copy in the mail If you have any lab test that is abnormal or we need to change your treatment, we will call you to review the results.  Testing/Procedures: None ordered  Follow-Up: At [facility], you and your health needs are our priority.  As part of our continuing mission to provide you with exceptional heart care, we have created designated Provider Care Teams.  These Care Teams include your primary Cardiologist (physician) and Advanced Practice Providers (APPs -  Physician Assistants and Nurse Practitioners) who all work together to provide you with the care you need, when you need it.  Your next appointment:   12 month(s)  The format for your next appointment:   In Person  Provider:    You may see Kathlyn Sacramento, MD or one of the following Advanced Practice Providers on your designated Care Team:    Murray Hodgkins, NP  Christell Faith, PA-C  Marrianne Mood, PA-C   Other Instructions N/A

## 2019-07-30 NOTE — Progress Notes (Signed)
Cardiology Office Note   Date:  07/30/2019   ID:  Steven Bolton, DOB May 14, 1961, MRN [id number]  PCP:  Albina Billet, MD  Cardiologist:   Kathlyn Sacramento, MD   Chief Complaint  Patient presents with  . other    12 month follow up. Meds reviewed by the pt. verbally. "doing well."       History of Present Illness: Steven Bolton is a 59 y.o. male who presents for a follow-up visit regarding coronary artery disease. He had non-ST elevation myocardial infarction in October 2015. Cardiac catheterization showed severe one-vessel coronary artery disease affecting the left circumflex. He underwent successful angioplasty and drug-eluting stent placement. Ejection fraction was low normal.   He had previous Hodgkin's lymphoma treated with chemotherapy and hyperlipidemia controlled with atorvastatin.  Echocardiogram in January 2020 showed an EF of 60 to 65% with mild tricuspid regurgitation. 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  He was able to exercise for 9 minutes and 44 seconds with normal blood pressure response to exercise and no ischemic changes. Is the Well with no chest pain or shortness of breath.   Past Medical History:  Diagnosis Date  . Coronary artery disease    a. 01/2014 NSTEMI/PCI: LM 20ost, LAD 20p, D1/2 nl, D2 small/nl, LCX 90p (3.5x12 Resolute Integrity DES), OM1/2/3 nl, LPL nl, RCA non-dominant, min irregs, EF 50%; b. 01/2014 Echo: EF 50-55%, diast dysfxn, mild MR, mildly dil LA; c. 08/2015 ETT: no ECG changes.  . Hodgkin's lymphoma ([place])   . Hyperlipidemia   . Hypothyroidism     Past Surgical History:  Procedure Laterality Date  . APPENDECTOMY  1977  . CARDIAC CATHETERIZATION  2015   x1 stent  . CORONARY ANGIOPLASTY    . NECK LESION BIOPSY  2001     Current Outpatient Medications  Medication Sig Dispense Refill  . aspirin 81 MG tablet Take 81 mg by mouth daily.    Marland Kitchen atorvastatin (LIPITOR) 40 MG tablet TAKE 1 TABLET BY MOUTH  DAILY 90  tablet 2  . azelastine (ASTELIN) 0.1 % nasal spray 1 spray as needed.     . cholecalciferol (VITAMIN D) 1000 UNITS tablet Take 1,000 Units by mouth 2 (two) times daily.    . Cyanocobalamin (VITAMIN B 12) 100 MCG LOZG Take by mouth daily.    Marland Kitchen escitalopram (LEXAPRO) 10 MG tablet Take 10 mg by mouth daily.    Marland Kitchen levothyroxine (SYNTHROID, LEVOTHROID) 88 MCG tablet Take 88 mcg by mouth daily before breakfast.     . nitroGLYCERIN (NITROSTAT) 0.4 MG SL tablet Place 1 tablet (0.4 mg total) under the tongue every 5 (five) minutes as needed for chest pain. 25 tablet 1  . Omega-3 Fatty Acids (FISH OIL) 1000 MG CAPS Take by mouth daily.     No current facility-administered medications for this visit.    Allergies:   Prednisone    Social History:  The patient  reports that he has never smoked. He has never used smokeless tobacco. He reports that he does not drink alcohol or use drugs.   Family History:  The patient's family history includes Cancer in his father; Heart attack in his mother; Lung cancer in his mother.    ROS:  Please see the history of present illness.   Otherwise, review of systems are positive for none.   All other systems are reviewed and negative.    PHYSICAL EXAM: VS:  BP 120/80 (BP Location: Left Arm, Patient  Position: Sitting, Cuff Size: Normal)   Pulse 76   Ht 5' 9.5" (1.765 m)   Wt 215 lb 8 oz (97.8 kg)   SpO2 99%   BMI 31.37 kg/m  , BMI Body mass index is 31.37 kg/m. GEN: Well nourished, well developed, in no acute distress  HEENT: normal  Neck: no JVD, carotid bruits, or masses Cardiac: RRR; no  rubs, or gallops,no edema . 2/6 SEM in pulmonic area.  Respiratory:  clear to auscultation bilaterally, normal work of breathing GI: soft, nontender, nondistended, + BS MS: no deformity or atrophy  Skin: warm and dry, no rash Neuro:  Strength and sensation are intact Psych: euthymic mood, full affect   EKG:  EKG is ordered today. The ekg ordered today demonstrates  normal sinus rhythm with no significant ST or T wave changes.   Recent Labs: No results found for requested labs within last 8760 hours.    Lipid Panel    Component Value Date/Time   CHOL 111 06/08/2014 0804   TRIG 73 06/08/2014 0804   HDL 46 06/08/2014 0804   LDLCALC 50 06/08/2014 0804      Wt Readings from Last 3 Encounters:  07/30/19 215 lb 8 oz (97.8 kg)  07/18/18 213 lb (96.6 kg)  07/01/17 212 lb 8 oz (96.4 kg)      No flowsheet data found.    ASSESSMENT AND PLAN:  1.  Coronary artery disease involving native coronary arteries without angina: He is doing extremely well with no anginal symptoms. Continue medical therapy.  2. Hyperlipidemia: Continue treatment with atorvastatin with a target LDL of less than 70.  His LDL has been below 70 and this gets checked by his primary care physician.    Disposition:   FU with me in 1 year  Signed,  Kathlyn Sacramento, MD  07/30/2019 8:52 AM    [place]

## 2020-03-12 ENCOUNTER — Other Ambulatory Visit: Payer: Self-pay | Admitting: Cardiovascular Disease

## 2020-03-28 ENCOUNTER — Telehealth: Payer: Self-pay | Admitting: Cardiovascular Disease

## 2020-03-28 DIAGNOSIS — I2510 Atherosclerotic heart disease of native coronary artery without angina pectoris: Secondary | ICD-10-CM

## 2020-03-28 NOTE — Telephone Encounter (Signed)
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

## 2020-03-31 NOTE — Telephone Encounter (Signed)
Order has been placed for the ETT and is in [place]. Message fwd to scheduling to contact the patient to schedule.

## 2020-03-31 NOTE — Telephone Encounter (Signed)
He does not need an echocardiogram.  A Treadmill stress test should be sufficient.  Please go ahead and arrange.  Thanks

## 2020-04-01 NOTE — Telephone Encounter (Signed)
Lmov to schedule. 10-15 afternoon Is available

## 2020-04-05 NOTE — Telephone Encounter (Signed)
Update fwd to Dr. Arida ?

## 2020-04-05 NOTE — Telephone Encounter (Signed)
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 Please call to discuss after 4 pm.

## 2020-04-07 NOTE — Telephone Encounter (Signed)
You can go ahead and schedule an echocardiogram if they really need that to clear him.  His ejection fraction in the past was normal so I am not entirely sure why they keep asking for an echo.

## 2020-04-07 NOTE — Telephone Encounter (Signed)
The order for the echo has been placed in Epic. Msg fwd to scheduling to contact the patients to schedule.

## 2020-05-18 ENCOUNTER — Other Ambulatory Visit: Payer: Self-pay | Admitting: Cardiovascular Disease

## 2020-05-18 DIAGNOSIS — I252 Old myocardial infarction: Secondary | ICD-10-CM

## 2020-05-18 DIAGNOSIS — I2510 Atherosclerotic heart disease of native coronary artery without angina pectoris: Secondary | ICD-10-CM

## 2020-06-01 ENCOUNTER — Other Ambulatory Visit: Payer: Self-pay

## 2020-06-01 ENCOUNTER — Ambulatory Visit (INDEPENDENT_AMBULATORY_CARE_PROVIDER_SITE_OTHER): Payer: 59

## 2020-06-01 DIAGNOSIS — I2510 Atherosclerotic heart disease of native coronary artery without angina pectoris: Secondary | ICD-10-CM

## 2020-06-01 DIAGNOSIS — I252 Old myocardial infarction: Secondary | ICD-10-CM

## 2020-06-01 LAB — ECHOCARDIOGRAM COMPLETE
AR max vel: 2.55 cm2
AV Area VTI: 2.9 cm2
AV Area mean vel: 2.79 cm2
AV Mean grad: 7 mmHg
AV Peak grad: 14.1 mmHg
Ao pk vel: 1.88 m/s
Area-P 1/2: 2.97 cm2
Calc EF: 53.7 %
S' Lateral: 3.2 cm
Single Plane A2C EF: 54.2 %
Single Plane A4C EF: 53.2 %

## 2020-06-03 ENCOUNTER — Telehealth: Payer: Self-pay

## 2020-06-03 NOTE — Telephone Encounter (Signed)
-----   Message from Wellington Hampshire, MD sent at 06/03/2020  1:15 PM EST ----- Inform patient that echo was normal.  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

## 2020-06-03 NOTE — Telephone Encounter (Signed)
DPR on file. lmom wit echo results. Patient is to contact the office if any questions.

## 2020-06-13 ENCOUNTER — Telehealth: Payer: Self-pay | Admitting: Cardiovascular Disease

## 2020-06-13 NOTE — Telephone Encounter (Signed)
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 Placed in Dr. Tyrell Antonio nurses box for further review

## 2020-06-15 NOTE — Telephone Encounter (Signed)
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

## 2020-06-15 NOTE — Telephone Encounter (Signed)
Left voicemail message that we have the form but provider has not signed at this time and to call back if further questions.

## 2020-06-16 NOTE — Telephone Encounter (Signed)
I signed his form.  It is in my out basket

## 2020-06-16 NOTE — Telephone Encounter (Addendum)
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 Fletcher Anon. Form placed at the front desk for the patient to pick it up as the pt requested.

## 2020-06-17 ENCOUNTER — Other Ambulatory Visit: Payer: 59

## 2020-07-21 NOTE — Progress Notes (Signed)
Cardiology Office Note    Date:  07/29/2020   ID:  Steven Bolton, DOB Dec 21, 1960, MRN [id number]  PCP:  Albina Billet, MD  Cardiologist:  Kathlyn Sacramento, MD  Electrophysiologist:  None   Chief Complaint: Follow up  History of Present Illness:   Steven Bolton is a 60 y.o. male with history of CAD with NSTEMI in 04/2014 s/p PCI/DES to the LCx, Hodgkin's lymphoma treated with chemotherapy, and HLD who presents for follow up of his CAD.   He was admitted to the hospital in 04/2014 with a NSTEMI.  Cardiac cath showed left main 20% stenosis, proximal LAD 20% stenosis, proximal LCx 90% stenosis status post PCI/DES.  EF by echo at that time was noted to be 50%.  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  He demonstrated appropriate blood pressure response.  He exercised for 9 minutes and 44 seconds with a peak heart rate of 150 bpm.  He achieved 11.3 METs.  He was last seen in the office in 07/2019 and was doing well from a cardiac perspective. 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   He comes in doing very well from a cardiac perspective.  No chest pain, dyspnea, palpitations, dizziness, presyncope, syncope, or lower extremity swelling.  He remains very active working out most days per week without cardiac limitation.  He is tolerating all medications without issues.  He continues to eat a heart healthy diet as best he can.  He does report having had follow-up labs with his PCP this past summer.  We will get these faxed to our office.  He does not have any issues or concerns at this time.   Labs  independently reviewed: 10/2014 - TSH normal, BUN 15, serum creatinine 1.07, potassium 4.6, albumin 4.6, AST normal, ALT 58, TC 111, TG 69, HDL 51, LDL 46  Past Medical History:  Diagnosis Date  . Coronary artery disease    a. 01/2014 NSTEMI/PCI: LM 20ost, LAD 20p, D1/2 nl, D2 small/nl, LCX 90p (3.5x12 Resolute Integrity DES), OM1/2/3 nl, LPL nl, RCA non-dominant, min irregs, EF 50%; b. 01/2014 Echo: EF 50-55%, diast dysfxn, mild MR, mildly dil LA; c. 08/2015 ETT: no ECG changes.  . Hodgkin's lymphoma ([place])   . Hyperlipidemia   . Hypothyroidism     Past Surgical History:  Procedure Laterality Date  . APPENDECTOMY  1977  . CARDIAC CATHETERIZATION  2015   x1 stent  . CORONARY ANGIOPLASTY    . NECK LESION BIOPSY  2001    Current Medications: Current Meds  Medication Sig  . aspirin 81 MG tablet Take 81 mg by mouth daily.  Marland Kitchen azelastine (ASTELIN) 0.1 % nasal spray 1 spray as needed.  . cholecalciferol (VITAMIN D) 1000 UNITS tablet Take 1,000 Units by mouth 2 (two) times daily.  . Cyanocobalamin (VITAMIN B 12) 100 MCG LOZG Take by mouth daily.  Marland Kitchen escitalopram (LEXAPRO) 10 MG tablet Take 10 mg by mouth daily.  Marland Kitchen levothyroxine (SYNTHROID, LEVOTHROID) 88 MCG tablet Take 88 mcg by mouth daily before breakfast.   . nitroGLYCERIN (NITROSTAT) 0.4 MG SL tablet  Place 1 tablet (0.4 mg total) under the tongue every 5 (five) minutes as needed for chest pain.  . Omega-3 Fatty Acids (FISH OIL) 1000 MG CAPS Take by mouth daily.  . [DISCONTINUED] atorvastatin (LIPITOR) 40 MG tablet TAKE 1 TABLET BY MOUTH  DAILY    Allergies:   Prednisone   Social History   Socioeconomic History  . Marital status: Married    Spouse name: Not on file  . Number of children: Not on file  . Years of education: Not on file  . Highest education level: Not on file  Occupational History  . Not on file  Tobacco Use  . Smoking status: Never Smoker  . Smokeless tobacco: Never Used  Substance and Sexual Activity  .  Alcohol use: No  . Drug use: No  . Sexual activity: Not on file  Other Topics Concern  . Not on file  Social History Narrative  . Not on file   Social Determinants of Health   Financial Resource Strain: Not on file  Food Insecurity: Not on file  Transportation Needs: Not on file  Physical Activity: Not on file  Stress: Not on file  Social Connections: Not on file     Family History:  The patient's family history includes Cancer in his father; Heart attack in his mother; Lung cancer in his mother.  ROS:   Review of Systems  Constitutional: Negative for chills, diaphoresis, fever, malaise/fatigue and weight loss.  HENT: Negative for congestion.   Eyes: Negative for discharge and redness.  Respiratory: Negative for cough, sputum production, shortness of breath and wheezing.   Cardiovascular: Negative for chest pain, palpitations, orthopnea, claudication, leg swelling and PND.  Gastrointestinal: Negative for abdominal pain, heartburn, nausea and vomiting.  Musculoskeletal: Negative for falls and myalgias.  Skin: Negative for rash.  Neurological: Negative for dizziness, tingling, tremors, sensory change, speech change, focal weakness, loss of consciousness and weakness.  Endo/Heme/Allergies: Does not bruise/bleed easily.  Psychiatric/Behavioral: Negative for substance abuse. The patient is not nervous/anxious.   All other systems reviewed and are negative.    EKGs/Labs/Other Studies Reviewed:    Studies reviewed were summarized above. The additional studies were reviewed today:  2D echo 06/2020: 1. Left ventricular ejection fraction, by estimation, is 60 to 65%. Left  ventricular ejection fraction by 3D volume is 65 %. The left ventricle has  normal function. The left ventricle has no regional wall motion  abnormalities. Left ventricular diastolic  parameters were normal.  2. Right ventricular systolic function is normal. The right ventricular  size is mildly enlarged.   3. The mitral valve is normal in structure. No evidence of mitral valve  regurgitation.  4. The aortic valve is tricuspid. Aortic valve regurgitation is trivial.  Mild aortic valve sclerosis is present, with no evidence of aortic valve  stenosis.  5. The inferior vena cava is normal in size with greater than 50%  respiratory variability, suggesting right atrial pressure of 3 mmHg. __________  ETT 06/2019:  Blood pressure demonstrated a normal response to exercise.  There was no ST segment deviation noted during stress.  No T wave inversion was noted during stress.   Normal treadmill stress test with no evidence of ischemia. Excellent exercise capacity with exercise duration of 9 minutes and 44 seconds achieving a maximum workload of 11.3 METS.  Normal blood pressure response to exercise.   EKG:  EKG is ordered today.  The EKG ordered today demonstrates NSR, 84 bpm, left axis deviation, no acute  ST-T changes, when compared to prior tracing no significant changes  Recent Labs: No results found for requested labs within last 8760 hours.  Recent Lipid Panel    Component Value Date/Time   CHOL 111 06/08/2014 0804   TRIG 73 06/08/2014 0804   HDL 46 06/08/2014 0804   LDLCALC 50 06/08/2014 0804    PHYSICAL EXAM:    VS:  BP 128/88 (BP Location: Left Arm, Patient Position: Sitting, Cuff Size: Normal)   Pulse 84   Ht 5' 9.5" (1.765 m)   Wt 209 lb (94.8 kg)   SpO2 97%   BMI 30.42 kg/m   BMI: Body mass index is 30.42 kg/m.  Physical Exam Vitals reviewed.  Constitutional:      Appearance: He is well-developed and well-nourished.  HENT:     Head: Normocephalic and atraumatic.  Eyes:     General:        Right eye: No discharge.        Left eye: No discharge.  Neck:     Vascular: No JVD.  Cardiovascular:     Rate and Rhythm: Normal rate and regular rhythm.     Pulses: No midsystolic click and no opening snap.          Posterior tibial pulses are 2+ on the right side  and 2+ on the left side.     Heart sounds: Normal heart sounds, S1 normal and S2 normal. Heart sounds not distant. No murmur heard. No friction rub.  Pulmonary:     Effort: Pulmonary effort is normal. No respiratory distress.     Breath sounds: Normal breath sounds. No decreased breath sounds, wheezing or rales.  Chest:     Chest wall: No tenderness.  Abdominal:     General: There is no distension.     Palpations: Abdomen is soft.     Tenderness: There is no abdominal tenderness.  Musculoskeletal:        General: No edema.     Cervical back: Normal range of motion.  Skin:    General: Skin is warm and dry.     Nails: There is no clubbing or cyanosis.  Neurological:     Mental Status: He is alert and oriented to person, place, and time.  Psychiatric:        Mood and Affect: Mood and affect normal.        Speech: Speech normal.        Behavior: Behavior normal.        Thought Content: Thought content normal.        Judgment: Judgment normal.     Wt Readings from Last 3 Encounters:  07/29/20 209 lb (94.8 kg)  07/30/19 215 lb 8 oz (97.8 kg)  07/18/18 213 lb (96.6 kg)     ASSESSMENT & PLAN:   1. CAD involving the native coronaries without angina: He is doing very well without any symptoms concerning for angina.  Continue current medical therapy including aspirin, atorvastatin, and as needed SL NTG.  No indication for further ischemic testing at this time.  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  2. HLD: This is followed by his PCP with last documented LDL being less than 70 with a mildly elevated ALT.  We will contact his PCP's office and request updated labs be faxed to our office.  He remains on atorvastatin.  Disposition: F/u with Dr. Fletcher Anon or an APP in 12 months, sooner if needed.   Medication Adjustments/Labs and Tests Ordered: Current  medicines are reviewed at length with the patient today.  Concerns regarding medicines are outlined above. Medication changes, Labs and Tests ordered today are summarized above and listed in the Patient Instructions accessible in Encounters.   Signed, Christell Faith, PA-C 07/29/2020 4:19 PM     [facility] [address] [place] [address] [place], [place] [address] [phone]

## 2020-07-29 ENCOUNTER — Other Ambulatory Visit: Payer: Self-pay

## 2020-07-29 ENCOUNTER — Ambulatory Visit (INDEPENDENT_AMBULATORY_CARE_PROVIDER_SITE_OTHER): Payer: 59 | Admitting: Physician Assistant

## 2020-07-29 ENCOUNTER — Encounter: Payer: Self-pay | Admitting: Physician Assistant

## 2020-07-29 VITALS — BP 128/88 | HR 84 | Ht 69.5 in | Wt 209.0 lb

## 2020-07-29 DIAGNOSIS — E785 Hyperlipidemia, unspecified: Secondary | ICD-10-CM

## 2020-07-29 DIAGNOSIS — I2510 Atherosclerotic heart disease of native coronary artery without angina pectoris: Secondary | ICD-10-CM | POA: Diagnosis not present

## 2020-07-29 MED ORDER — ATORVASTATIN CALCIUM 40 MG PO TABS
40.0000 mg | ORAL_TABLET | Freq: Every day | ORAL | 0 refills | Status: DC
Start: 2020-07-29 — End: 2020-10-05

## 2020-07-29 NOTE — Patient Instructions (Signed)
Medication Instructions:  No changes  *If you need a refill on your cardiac medications before your next appointment, please call your pharmacy*   Lab Work: None  If you have labs (blood work) drawn today and your tests are completely normal, you will receive your results only by: Marland Kitchen MyChart Message (if you have MyChart) OR . A paper copy in the mail If you have any lab test that is abnormal or we need to change your treatment, we will call you to review the results.   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 For further information please visit [email or web address]. Please also follow instruction sheet, as given.  Patient will need COVID swab and instructions prior to this test and will need this information provided.   Follow-Up: At [facility], you and your health needs are our priority.  As part of our continuing mission to provide you with exceptional heart care, we have created designated Provider Care Teams.  These Care Teams include your primary Cardiologist (physician) and Advanced Practice Providers (APPs -  Physician Assistants and Nurse Practitioners) who all work together to provide you with the care you need, when you need it.   Your next appointment:   1 year(s)  The format for your next appointment:   In Person  Provider:   You may see Kathlyn Sacramento, MD or one of the following Advanced Practice Providers on your designated Care Team:    Murray Hodgkins, NP  Christell Faith, PA-C  Marrianne Mood, PA-C  Cadence Weston, [place]  Laurann Montana, NP

## 2020-10-05 ENCOUNTER — Other Ambulatory Visit: Payer: Self-pay | Admitting: Physician Assistant

## 2021-05-05 ENCOUNTER — Telehealth: Payer: Self-pay | Admitting: Physician Assistant

## 2021-05-05 NOTE — Telephone Encounter (Signed)
GXT scheduled for Monday 05/08/21 at 9:00 am.  - you may eat a light breakfast/ lunch prior to your procedure - no caffeine for 24 hours prior to your test (coffee, tea, soft drinks, or chocolate)  - no smoking/ vaping for 4 hours prior to your test - you may take your regular medications the day of your test  - bring any inhalers with you to your test - wear comfortable clothing & tennis/ non-skid shoes to walk on the treadmill  Attempted to call the patient at his home #. No answer- I did leave detailed message of all of the above recommendations (ok per DPR). I asked that he arrive at ~ 8:45 am and call back with any further questions or concerns.

## 2021-05-05 NOTE — Telephone Encounter (Signed)
Reminder to triage   Treadmill Monday call with instructions. Marland Kitchen

## 2021-05-08 ENCOUNTER — Telehealth: Payer: Self-pay | Admitting: Physician Assistant

## 2021-05-08 ENCOUNTER — Other Ambulatory Visit: Payer: Self-pay

## 2021-05-08 ENCOUNTER — Ambulatory Visit (INDEPENDENT_AMBULATORY_CARE_PROVIDER_SITE_OTHER): Payer: 59

## 2021-05-08 DIAGNOSIS — I2510 Atherosclerotic heart disease of native coronary artery without angina pectoris: Secondary | ICD-10-CM

## 2021-05-08 NOTE — Telephone Encounter (Signed)
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  He was last seen in the office on 07/29/20 by Christell Faith, PA.  He has a history of CAD and plans were made at that time for repeat GXT testing in 05/2021.  The patient is without any cardiac complaints today. His GXT has been performed and sent to Dr. Fletcher Anon to read.  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  [id number] [address] [place], [place] [address] Phone: [phone] Fax: [phone]  I advised the patient his GXT will be sent to Dr. Fletcher Anon to read and then to Christell Faith, PA to sign off. He will be contacted with his results by nursing once available. The patient voiced understanding and was very appreciative.   He did go ahead and schedule his follow up in the office for 06/23/21 with Dr. Fletcher Anon, as his insurance will be changing at the beginning of the year and we are going to fall out of network for him.   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

## 2021-05-09 NOTE — Telephone Encounter (Signed)
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

## 2021-05-12 ENCOUNTER — Telehealth: Payer: Self-pay | Admitting: *Deleted

## 2021-05-12 ENCOUNTER — Telehealth: Payer: Self-pay | Admitting: Physician Assistant

## 2021-05-12 LAB — EXERCISE TOLERANCE TEST
Angina Index: 0
Base ST Depression (mm): 0 mm
Duke Treadmill Score: 11
Estimated workload: 13.4
Exercise duration (min): 11 min
Exercise duration (sec): 3 s
MPHR: 161 {beats}/min
Peak HR: 153 {beats}/min
Percent HR: 95 %
Rest HR: 76 {beats}/min
ST Depression (mm): 0 mm

## 2021-05-12 NOTE — Telephone Encounter (Signed)
Reviewed results with patient and he stated that letter just needs to be sent to [facility]. Advised that I would get that faxed over to them and if any further needs just let us know. He verbalized understanding with no further questions at this time.

## 2021-05-12 NOTE — Telephone Encounter (Signed)
-----   Message from Rise Mu, PA-C sent at 05/12/2021 12:53 PM EST ----- Please inform the patient his stress test was normal and demonstrated excellent exercise capacity.  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  Please let me know if there is any paperwork that needs to be signed.

## 2021-05-12 NOTE — Telephone Encounter (Signed)
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

## 2021-05-12 NOTE — Telephone Encounter (Signed)
Patient returning call.

## 2021-05-12 NOTE — Telephone Encounter (Signed)
Left voicemail message to call back for review of results.  

## 2021-05-15 NOTE — Telephone Encounter (Signed)
See other telephone encounter dated 05/12/21. Patient aware letter was completed and also faxed to location requested.

## 2021-05-16 NOTE — Telephone Encounter (Signed)
See 05/12/21 phone note- closing this encounter.

## 2021-06-23 ENCOUNTER — Ambulatory Visit: Payer: 59 | Admitting: Cardiovascular Disease

## 2021-07-28 ENCOUNTER — Other Ambulatory Visit: Payer: Self-pay | Admitting: Physician Assistant

## 2021-08-04 ENCOUNTER — Ambulatory Visit: Payer: 59 | Admitting: Cardiovascular Disease

## 2021-08-04 ENCOUNTER — Encounter: Payer: Self-pay | Admitting: Cardiovascular Disease

## 2021-08-04 ENCOUNTER — Other Ambulatory Visit: Payer: Self-pay

## 2021-08-04 VITALS — BP 122/84 | HR 70 | Ht 69.5 in | Wt 193.5 lb

## 2021-08-04 DIAGNOSIS — I2510 Atherosclerotic heart disease of native coronary artery without angina pectoris: Secondary | ICD-10-CM

## 2021-08-04 DIAGNOSIS — E785 Hyperlipidemia, unspecified: Secondary | ICD-10-CM

## 2021-08-04 NOTE — Progress Notes (Signed)
Cardiology Office Note   Date:  08/04/2021   ID:  RHODERICK Bolton, DOB 1961/02/25, MRN [id number]  PCP:  Albina Billet, MD  Cardiologist:   Kathlyn Sacramento, MD   Chief Complaint  Patient presents with   Other    12 month f/u no complaints today. Meds reviewed verbally with pt.      History of Present Illness: Steven Bolton is a 61 y.o. male who presents for a follow-up visit regarding coronary artery disease. He had non-ST elevation myocardial infarction in October 2015. Cardiac catheterization showed severe one-vessel coronary artery disease affecting the left circumflex. He underwent successful angioplasty and drug-eluting stent placement. Ejection fraction was low normal.   He had previous Hodgkin's lymphoma treated with chemotherapy and hyperlipidemia controlled with atorvastatin.  Most recent echocardiogram in December 2021 showed normal LV systolic function with aortic sclerosis without significant stenosis.  He had a treadmill stress test done in November 2022 which was negative for ischemia with excellent exercise capacity.  He has been doing very well with no chest pain, shortness of breath or palpitations.  He is thinking about retiring in the near future.  Past Medical History:  Diagnosis Date   Coronary artery disease    a. 01/2014 NSTEMI/PCI: LM 20ost, LAD 20p, D1/2 nl, D2 small/nl, LCX 90p (3.5x12 Resolute Integrity DES), OM1/2/3 nl, LPL nl, RCA non-dominant, min irregs, EF 50%; b. 01/2014 Echo: EF 50-55%, diast dysfxn, mild MR, mildly dil LA; c. 08/2015 ETT: no ECG changes.   Hodgkin's lymphoma ([place])    Hyperlipidemia    Hypothyroidism     Past Surgical History:  Procedure Laterality Date   APPENDECTOMY  1977   CARDIAC CATHETERIZATION  2015   x1 stent   CORONARY ANGIOPLASTY     NECK LESION BIOPSY  2001     Current Outpatient Medications  Medication Sig Dispense Refill   aspirin 81 MG tablet Take 81 mg by mouth daily.     atorvastatin (LIPITOR) 40 MG  tablet TAKE 1 TABLET BY MOUTH  DAILY 90 tablet 0   azelastine (ASTELIN) 0.1 % nasal spray 1 spray as needed.     cholecalciferol (VITAMIN D) 1000 UNITS tablet Take 1,000 Units by mouth 2 (two) times daily.     Cyanocobalamin (VITAMIN B 12) 100 MCG LOZG Take by mouth daily.     escitalopram (LEXAPRO) 10 MG tablet Take 10 mg by mouth daily.     levothyroxine (SYNTHROID, LEVOTHROID) 88 MCG tablet Take 88 mcg by mouth daily before breakfast.      Multiple Vitamins-Minerals (ZINC PO) Take by mouth daily.     nitroGLYCERIN (NITROSTAT) 0.4 MG SL tablet Place 1 tablet (0.4 mg total) under the tongue every 5 (five) minutes as needed for chest pain. 25 tablet 1   Omega-3 Fatty Acids (FISH OIL) 1000 MG CAPS Take by mouth daily.     No current facility-administered medications for this visit.    Allergies:   Prednisone    Social History:  The patient  reports that he has never smoked. He has never used smokeless tobacco. He reports that he does not drink alcohol and does not use drugs.   Family History:  The patient's family history includes Cancer in his father; Heart attack in his mother; Lung cancer in his mother.    ROS:  Please see the history of present illness.   Otherwise, review of systems are positive for none.   All other systems are reviewed and negative.  PHYSICAL EXAM: VS:  BP 122/84 (BP Location: Left Arm, Patient Position: Sitting, Cuff Size: Normal)    Pulse 70    Ht 5' 9.5" (1.765 m)    Wt 193 lb 8 oz (87.8 kg)    SpO2 98%    BMI 28.17 kg/m  , BMI Body mass index is 28.17 kg/m. GEN: Well nourished, well developed, in no acute distress  HEENT: normal  Neck: no JVD, carotid bruits, or masses Cardiac: RRR; no  rubs, or gallops,no edema . 2/6 SEM in aortic area Respiratory:  clear to auscultation bilaterally, normal work of breathing GI: soft, nontender, nondistended, + BS MS: no deformity or atrophy  Skin: warm and dry, no rash Neuro:  Strength and sensation are  intact Psych: euthymic mood, full affect   EKG:  EKG is ordered today. The ekg ordered today demonstrates normal sinus rhythm with no significant ST or T wave changes.    Recent Labs: No results found for requested labs within last 8760 hours.    Lipid Panel    Component Value Date/Time   CHOL 111 06/08/2014 0804   TRIG 73 06/08/2014 0804   HDL 46 06/08/2014 0804   LDLCALC 50 06/08/2014 0804      Wt Readings from Last 3 Encounters:  08/04/21 193 lb 8 oz (87.8 kg)  07/29/20 209 lb (94.8 kg)  07/30/19 215 lb 8 oz (97.8 kg)      No flowsheet data found.    ASSESSMENT AND PLAN:  1.  Coronary artery disease involving native coronary arteries without angina: He is doing extremely well with no anginal symptoms. Continue medical therapy.  2. Hyperlipidemia: Continue treatment with atorvastatin with a target LDL of less than 70.  His LDL has been below 70 and this gets checked by his primary care physician.  3.  Cardiac murmur: Consistent with aortic sclerosis.  Most recent echocardiogram showed no significant stenosis.    Disposition:   FU with me in 1 year  Signed,  Kathlyn Sacramento, MD  08/04/2021 5:07 PM    [place]

## 2021-08-04 NOTE — Patient Instructions (Signed)

## 2021-08-14 ENCOUNTER — Other Ambulatory Visit: Payer: Self-pay | Admitting: Cardiovascular Disease

## 2021-10-11 ENCOUNTER — Other Ambulatory Visit: Payer: Self-pay | Admitting: Cardiovascular Disease
